# Patient Record
Sex: Male | Born: 1955 | Race: Black or African American | Hispanic: No | Marital: Single | Smoking: Current every day smoker
Health system: Southern US, Community
[De-identification: ages and names within clinical notes are randomized; demographics above are authoritative.]

## PROBLEM LIST (undated history)

## (undated) DIAGNOSIS — E119 Type 2 diabetes mellitus without complications: Secondary | ICD-10-CM

## (undated) DIAGNOSIS — C801 Malignant (primary) neoplasm, unspecified: Secondary | ICD-10-CM

## (undated) DIAGNOSIS — J9819 Other pulmonary collapse: Secondary | ICD-10-CM

## (undated) DIAGNOSIS — I10 Essential (primary) hypertension: Secondary | ICD-10-CM

## (undated) HISTORY — PX: BELOW KNEE LEG AMPUTATION: SUR23

---

## 2020-12-17 ENCOUNTER — Encounter: Payer: Self-pay | Admitting: Urology

## 2020-12-17 ENCOUNTER — Ambulatory Visit (INDEPENDENT_AMBULATORY_CARE_PROVIDER_SITE_OTHER): Payer: Medicare PPO | Admitting: Urology

## 2020-12-17 ENCOUNTER — Other Ambulatory Visit: Payer: Self-pay

## 2020-12-17 VITALS — BP 139/66 | HR 104 | Temp 98.9°F

## 2020-12-17 DIAGNOSIS — R972 Elevated prostate specific antigen [PSA]: Secondary | ICD-10-CM

## 2020-12-17 LAB — URINALYSIS, ROUTINE W REFLEX MICROSCOPIC
Bilirubin, UA: NEGATIVE
Glucose, UA: NEGATIVE
Nitrite, UA: NEGATIVE
Protein,UA: NEGATIVE
RBC, UA: NEGATIVE
Specific Gravity, UA: 1.02 (ref 1.005–1.030)
Urobilinogen, Ur: 0.2 mg/dL (ref 0.2–1.0)
pH, UA: 5.5 (ref 5.0–7.5)

## 2020-12-17 LAB — MICROSCOPIC EXAMINATION
Bacteria, UA: NONE SEEN
RBC, Urine: NONE SEEN /hpf (ref 0–2)
Renal Epithel, UA: NONE SEEN /hpf

## 2020-12-17 MED ORDER — LEVOFLOXACIN 750 MG PO TABS: 750.0000 mg | ORAL_TABLET | Freq: Once | ORAL | 0 refills | Status: AC

## 2020-12-17 NOTE — Progress Notes (Signed)
Urological Symptom Review  Patient is experiencing the following symptoms: Frequent urination Get up at night to urinate Leakage of urine   Review of Systems  Gastrointestinal (upper)  : Negative for upper GI symptoms  Gastrointestinal (lower) : Negative for lower GI symptoms  Constitutional : Negative for symptoms  Skin: Negative for skin symptoms  Eyes: Negative for eye symptoms  Ear/Nose/Throat : Negative for Ear/Nose/Throat symptoms  Hematologic/Lymphatic: Easy bruising  Cardiovascular : Negative for cardiovascular symptoms  Respiratory : Negative for respiratory symptoms  Endocrine: Negative for endocrine symptoms  Musculoskeletal: Negative for musculoskeletal symptoms  Neurological: Negative for neurological symptoms  Psychologic: Negative for psychiatric symptoms

## 2020-12-17 NOTE — Progress Notes (Signed)
Sent via fax to Dr. Tobey Grim [facility] fax # [phone]

## 2020-12-17 NOTE — Patient Instructions (Addendum)
   Appointment Time:12:30 Please arrive by 12:15 Appointment Date:01/14/2021  Location: [facility]   Prostate Biopsy Instructions  Stop all aspirin or blood thinners (aspirin, plavix, coumadin, warfarin, motrin, ibuprofen, advil, aleve, naproxen, naprosyn) for 7 days prior to the procedure.  If you have any questions about stopping these medications, please contact your primary care physician or cardiologist.  Having a light meal prior to the procedure is recommended.  If you are diabetic or have low blood sugar please bring a small snack or glucose tablet.  A Fleets enema is needed to be purchased over the counter at a local pharmacy and used 2 hours before you scheduled appointment.  This can be purchased over the counter at any pharmacy.  Antibiotics will be administered in the clinic at the time of the procedure and 1 tablet has been sent to your pharmacy. Please take the antibiotic as prescribed.    Please bring someone with you to the procedure to drive you home if you are given a valium to take prior to your procedure.   If you have any questions or concerns, please feel free to call the office at [phone] or send a Mychart message.    We will call you for your follow up biopsy talk with Dr. Leonidas Romberg    Thank you, [facility]

## 2020-12-17 NOTE — Progress Notes (Signed)
12/17/2020 11:24 AM   Andrew Ferrell [birth year] [id number]  Referring provider: No referring provider defined for this encounter.  No chief complaint on file.   HPI:  New patient-  1) PSA elevation-patient with a PSA September 2022 of 7.5.  His prior PSA March 2022 was 10.5, September 2020 4.9, July 2019 5.04. No h/o BPH. AUASS = 17. Mixed QOL.  No prior biopsy.   His dad had PCa.   He is on Plavix. He's not sure why he's on it. 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 He saw Dr. Tobey Grim at [facility] - fax [phone]   PMH: No past medical history on file.  Surgical History: Left BKA   Home Medications:  Allergies as of 12/17/2020   Not on File      Medication List        Accurate as of December 17, 2020 11:24 AM. If you have any questions, ask your nurse or doctor.          acetaminophen 500 MG tablet Commonly known as: TYLENOL Take by mouth.   aspirin 81 MG EC tablet Take by mouth.   atorvastatin 20 MG tablet Commonly known as: LIPITOR Take by mouth.   benazepril-hydrochlorthiazide 5-6.25 MG tablet Commonly known as: LOTENSIN HCT Take 1 tablet by mouth daily.   clopidogrel 75 MG tablet Commonly known as: PLAVIX Take by mouth.   empagliflozin 25 MG Tabs tablet Commonly known as: JARDIANCE Take by mouth.   gabapentin 100 MG capsule Commonly known as: NEURONTIN Take by mouth.   gabapentin 300 MG capsule Commonly known as: NEURONTIN Take by mouth.   glipiZIDE 10 MG tablet Commonly known as: GLUCOTROL Take by mouth.   metFORMIN 1000 MG tablet Commonly known as: GLUCOPHAGE Take by mouth.   pioglitazone 15 MG tablet Commonly known as: ACTOS Take by mouth.        Allergies: Not on File  Family History: No family history on file.  Social History:  reports that he has been smoking cigarettes. He has been smoking an average of .25 packs per day. He uses smokeless tobacco. No history on file for  alcohol use and drug use.   Physical Exam: BP 139/66   Pulse (!) 104   Temp 98.9 F (37.2 C)   Constitutional:  Alert and oriented, No acute distress. HEENT: [place] AT, moist mucus membranes.  Trachea midline, no masses. Cardiovascular: No clubbing, cyanosis, or edema. Respiratory: Normal respiratory effort, no increased work of breathing. GI: Abdomen is soft, nontender, nondistended, no abdominal masses GU: No CVA tenderness Lymph: No cervical or inguinal lymphadenopathy. Skin: No rashes, bruises or suspicious lesions. Neurologic: Grossly intact, no focal deficits, moving all 4 extremities. Psychiatric: Normal mood and affect. GU: Penis circumcised, normal foreskin, testicles descended bilaterally and palpably normal, bilateral epididymis palpably normal, scrotum normal DRE: Prostate 30 g, right nodule about 10 mm near base  Laboratory Data: No results found for: WBC, HGB, HCT, MCV, PLT  No results found for: CREATININE  No results found for: PSA  No results found for: TESTOSTERONE  No results found for: HGBA1C  Urinalysis No results found for: COLORURINE, APPEARANCEUR, LABSPEC, PHURINE, GLUCOSEU, HGBUR, BILIRUBINUR, KETONESUR, PROTEINUR, UROBILINOGEN, NITRITE, LEUKOCYTESUR  No results found for: LABMICR, WBCUA, RBCUA, LABEPIT, MUCUS, BACTERIA    Assessment & Plan:    1. Elevated PSA I had a long discussion with the patient on the nature of elevated PSA - benign vs malignant causes. We  discussed age specific levels and that PCa can be seen on a biopsy with very low PSA levels (<=2.5). We discussed the nature risks and benefits of continued surveillance, other lab tests, imaging as well as prostate biopsy. We discussed the management of prostate cancer might include active surveillance or treatment depending on biopsy findings. All questions answered. I recommended a prostate biopsy and PSA was repeated.  He said that he had a spot on his lung that is just being monitored and  requested we do the same.  We discussed with his PSA levels (elevated since 2019) and prostate exam, he could have a significant prostate cancer that could be life-threatening.  Again I did recommend a biopsy and we went over the risk of bleeding and infection among others.  I could sense some hesitation.  He said he might get a second opinion, which I encouraged. Again I told him he could have a life-threatening problem and recommended biopsy.  I will repeat a PSA and have nurse go over biopsy instructions.  Again we discussed other lab tests or MRI but with his PSA being elevated and abnormal exam or biopsy makes sense upfront.  If it MRI were positive he need a biopsy and if it were negative we would still recommend a biopsy. He will consider.   - Urinalysis, Routine w reflex microscopic   No follow-ups on file.  Festus Aloe, MD  [facility]  [address] [place], [place] [address] [phone]

## 2020-12-18 LAB — PSA: Prostate Specific Ag, Serum: 8.8 ng/mL — ABNORMAL HIGH (ref 0.0–4.0)

## 2020-12-24 ENCOUNTER — Telehealth: Payer: Self-pay

## 2020-12-24 NOTE — Telephone Encounter (Signed)
Patient called today and needed clarification for prostate biopsy instructions.    Appointment Time: Appointment Date:  Location: [facility]   Prostate Biopsy Instructions  Stop all aspirin or blood thinners (aspirin, plavix, coumadin, warfarin, motrin, ibuprofen, advil, aleve, naproxen, naprosyn) for 7 days prior to the procedure.  If you have any questions about stopping these medications, please contact your primary care physician or cardiologist.  Having a light meal prior to the procedure is recommended.  If you are diabetic or have low blood sugar please bring a small snack or glucose tablet.  A Fleets enema is needed to be purchased over the counter at a local pharmacy and used 2 hours before you scheduled appointment.  This can be purchased over the counter at any pharmacy.  Antibiotics will be administered in the clinic at the time of the procedure and 1 tablet has been sent to your pharmacy. Please take the antibiotic as prescribed.    Please bring someone with you to the procedure to drive you home if you are given a valium to take prior to your procedure.   If you have any questions or concerns, please feel free to call the office at [phone] or send a Mychart message.    Reviewed with patient again. Patient voiced understanding.

## 2021-01-14 ENCOUNTER — Ambulatory Visit: Payer: Medicare PPO | Admitting: Urology

## 2021-01-14 ENCOUNTER — Ambulatory Visit (HOSPITAL_COMMUNITY)
Admission: RE | Admit: 2021-01-14 | Discharge: 2021-01-14 | Disposition: A | Discharge status: Home / Self Care | Payer: Medicare PPO | Source: Ambulatory Visit | Attending: Urology | Admitting: Urology

## 2021-01-14 ENCOUNTER — Other Ambulatory Visit: Payer: Self-pay

## 2021-01-14 ENCOUNTER — Other Ambulatory Visit: Payer: Self-pay | Admitting: Urology

## 2021-01-14 DIAGNOSIS — Z89512 Acquired absence of left leg below knee: Secondary | ICD-10-CM | POA: Diagnosis not present

## 2021-01-14 DIAGNOSIS — R972 Elevated prostate specific antigen [PSA]: Secondary | ICD-10-CM | POA: Diagnosis present

## 2021-01-14 DIAGNOSIS — Z7901 Long term (current) use of anticoagulants: Secondary | ICD-10-CM | POA: Insufficient documentation

## 2021-01-14 DIAGNOSIS — C61 Malignant neoplasm of prostate: Secondary | ICD-10-CM | POA: Diagnosis not present

## 2021-01-14 DIAGNOSIS — Z8042 Family history of malignant neoplasm of prostate: Secondary | ICD-10-CM | POA: Diagnosis not present

## 2021-01-14 MED ORDER — LIDOCAINE HCL (PF) 1 % IJ SOLN
INTRAMUSCULAR | Status: AC
  Administered 2021-01-14: 13:00:00 1 mL via INTRAMUSCULAR
  Filled 2021-01-14: qty 5

## 2021-01-14 MED ORDER — GENTAMICIN SULFATE 40 MG/ML IJ SOLN
INTRAMUSCULAR | Status: AC
  Administered 2021-01-14: 13:00:00 80 mg via INTRAMUSCULAR
  Filled 2021-01-14: qty 2

## 2021-01-14 MED ORDER — GENTAMICIN SULFATE 40 MG/ML IJ SOLN: 160.0000 mg | Freq: Once | INTRAMUSCULAR | Status: DC

## 2021-01-14 MED ORDER — LIDOCAINE HCL (PF) 1 % IJ SOLN: 1.0000 mL | Freq: Once | INTRAMUSCULAR | Status: DC

## 2021-01-14 NOTE — Progress Notes (Signed)
Prostate Biopsy Procedure   1) PSA elevation-patient with a PSA September 2022 of 7.5. His prior PSA March 2022 was 10.5, September 2020 4.9, July 2019 5.04.  No h/o BPH. AUASS = 17. Mixed QOL.  No prior biopsy.    His dad had PCa.    He is on Plavix. He's not sure why he's on it. 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 He saw Dr. Tobey Grim at [facility] - fax [phone]  He had a right base prostate nodule on exam Nov 2022 with repeat PSA 8.8.    Informed consent was obtained after discussing risks/benefits of the procedure.  A time out was performed to ensure correct patient identity.  Pre-Procedure: - Last PSA Level: No results found for: PSA - Gentamicin given prophylactically - Levaquin 500 mg administered PO -DRE: left mid hard nodule -Transrectal Ultrasound performed revealing a 84.16 gm prostate -No significant hypoechoic or median lobe noted  Procedure: - Prostate block performed using 10 cc 1% lidocaine and biopsies taken from sextant areas, a total of 12 under ultrasound guidance.  Post-Procedure: - Patient tolerated the procedure well - He was counseled to seek immediate medical attention if experiences any severe pain, significant bleeding, or fevers - Return in one week to discuss biopsy results  -Discussed with daughter in waiting room - nature of PSA elevation,management of bx results, BPH and PSAD - pt asked me to discuss with his daughter

## 2021-01-23 ENCOUNTER — Telehealth: Payer: Self-pay | Admitting: Urology

## 2021-01-23 DIAGNOSIS — C61 Malignant neoplasm of prostate: Secondary | ICD-10-CM

## 2021-01-23 NOTE — Telephone Encounter (Signed)
I called and spoke to Andrew Ferrell.  He is doing well after the biopsy.  We went over his stage grade and prognosis and discussed the management of prostate cancer.  We discussed surgery versus radiation.  He will consider.  He will need a PET scan.  We will send the results to the [facility] so that they can treat him or refer for treatment (Dr. Tobey Grim at [facility] - fax [phone]).  December 2022 high risk prostate cancer PSA 8.8 (7.5-10.5) T2b (left nodule) Gleason 4+5, 4+4, 4+3 in 10 - 40% of the cores, all on the left. 4/12

## 2021-02-04 ENCOUNTER — Telehealth: Payer: Self-pay | Admitting: Urology

## 2021-02-04 NOTE — Telephone Encounter (Signed)
Telephone encounter created in error.

## 2021-02-11 ENCOUNTER — Encounter: Payer: Self-pay | Admitting: Urology

## 2021-02-11 ENCOUNTER — Other Ambulatory Visit: Payer: Self-pay

## 2021-02-11 ENCOUNTER — Telehealth: Payer: Self-pay

## 2021-02-11 ENCOUNTER — Ambulatory Visit (INDEPENDENT_AMBULATORY_CARE_PROVIDER_SITE_OTHER): Payer: Medicare PPO | Admitting: Urology

## 2021-02-11 VITALS — BP 178/81 | HR 76

## 2021-02-11 DIAGNOSIS — C61 Malignant neoplasm of prostate: Secondary | ICD-10-CM | POA: Diagnosis not present

## 2021-02-11 NOTE — Telephone Encounter (Signed)
Patient called and made aware that referral was placed to Dr. [facility] and to notify [facility]. Patient voiced understanding and will call [facility].

## 2021-02-11 NOTE — Progress Notes (Signed)
02/11/2021 2:16 PM   Andrew Ferrell [birth year] [id number]  Referring provider: No referring provider defined for this encounter.  No chief complaint on file.   HPI:  F/u -   1) Prostate cancer - Andrew Ferrell was diagnosed with high risk prostate cancer December 2022.  He follows up today to review biopsy results.  Biopsy: December 2022 high risk prostate cancer PSA 8.8 (7.5-10.5) T2b (left nodule) Gleason 4+5, 4+4, 4+3 in 10 - 40% of the cores, all on the left. 4/12  Biopsy done due to elevated PSA with PSA September 2022 of 7.5. His prior PSA March 2022 was 10.5, September 2020 4.9, July 2019 5.04 and a left sided prostate nodule on exam.    No h/o BPH. AUASS was 17 but today a zero - reports he is voiding well. Only has Noc x 2-3.    His dad had PCa.    He is on Plavix. He's not sure why he's on it. 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 He has DM. He saw Dr. Tobey Grim at [facility] - fax [phone]   PMH: No past medical history on file.  Surgical History: No past surgical history on file.  Home Medications:  Allergies as of 02/11/2021   No Known Allergies      Medication List        Accurate as of February 11, 2021  2:16 PM. If you have any questions, ask your nurse or doctor.          acetaminophen 500 MG tablet Commonly known as: TYLENOL Take by mouth.   aspirin 81 MG EC tablet Take by mouth.   atorvastatin 20 MG tablet Commonly known as: LIPITOR Take by mouth.   benazepril-hydrochlorthiazide 5-6.25 MG tablet Commonly known as: LOTENSIN HCT Take 1 tablet by mouth daily.   clopidogrel 75 MG tablet Commonly known as: PLAVIX Take by mouth.   empagliflozin 25 MG Tabs tablet Commonly known as: JARDIANCE Take by mouth.   gabapentin 100 MG capsule Commonly known as: NEURONTIN Take by mouth.   gabapentin 300 MG capsule Commonly known as: NEURONTIN Take by mouth.   glipiZIDE 10 MG tablet Commonly known as:  GLUCOTROL Take by mouth.   metFORMIN 1000 MG tablet Commonly known as: GLUCOPHAGE Take by mouth.   pioglitazone 15 MG tablet Commonly known as: ACTOS Take by mouth.        Allergies: No Known Allergies  Family History: No family history on file.  Social History:  reports that he has been smoking cigarettes. He has been smoking an average of .25 packs per day. He uses smokeless tobacco. No history on file for alcohol use and drug use.   Physical Exam: BP (!) 178/81    Pulse 76   Constitutional:  Alert and oriented, No acute distress. HEENT: [place] AT, moist mucus membranes.  Trachea midline, no masses. Cardiovascular: No clubbing, cyanosis, or edema. Respiratory: Normal respiratory effort, no increased work of breathing. GI: Abdomen is soft, nontender, nondistended, no abdominal masses Skin: No rashes, bruises or suspicious lesions. Neurologic: Grossly intact, no focal deficits, moving all 4 extremities. Psychiatric: Normal mood and affect.  Laboratory Data: No results found for: WBC, HGB, HCT, MCV, PLT  No results found for: CREATININE  No results found for: PSA  No results found for: TESTOSTERONE  No results found for: HGBA1C  Urinalysis    Component Value Date/Time   APPEARANCEUR Clear 12/17/2020 1212   GLUCOSEU Negative 12/17/2020 1212  BILIRUBINUR Negative 12/17/2020 1212   PROTEINUR Negative 12/17/2020 1212   NITRITE Negative 12/17/2020 1212   LEUKOCYTESUR Trace (A) 12/17/2020 1212    Lab Results  Component Value Date   LABMICR See below: 12/17/2020   WBCUA 0-5 12/17/2020   LABEPIT 0-10 12/17/2020   BACTERIA None seen 12/17/2020      Assessment & Plan:    Prostate cancer - I had a long discussion with the patient and his three daughters using the Prostate Cancer handout, the prostate cancer door poster and his pathology report as a guide. We went his stage, grade and prognosis and the relevant anatomy. We discussed the nature risks and benefits of  active surveillance, radical prostatectomy, IMRT (+/- brachytherapy, +/- ADT). We discussed specifically how each treatment might affect the bowel, bladder and sexual function. We discussed how each treatment might effect salvage treatments. We discussed the role of other modalities in the treatment of prostate cancer including chemotherapy, HIFU and cryotherapy and whole gland vs focal therapy.   After our discussion we will stage with PET scan and refer to Dr. Sharyn Dross, [place] for discussion.    No follow-ups on file.  Festus Aloe, MD  [facility]  [address] [place], [place] [address] [phone]

## 2021-02-11 NOTE — Progress Notes (Signed)

## 2021-02-26 ENCOUNTER — Encounter (HOSPITAL_COMMUNITY)
Admission: RE | Admit: 2021-02-26 | Discharge: 2021-02-26 | Disposition: A | Discharge status: Home / Self Care | Payer: Medicare PPO | Source: Ambulatory Visit | Attending: Urology | Admitting: Urology

## 2021-02-26 ENCOUNTER — Other Ambulatory Visit: Payer: Self-pay

## 2021-02-26 DIAGNOSIS — C61 Malignant neoplasm of prostate: Secondary | ICD-10-CM | POA: Diagnosis present

## 2021-02-26 MED ORDER — PIFLIFOLASTAT F 18 (PYLARIFY) INJECTION
9.0000 | Freq: Once | INTRAVENOUS | Status: AC
  Administered 2021-02-26: 7.5 via INTRAVENOUS

## 2021-02-28 ENCOUNTER — Telehealth: Payer: Self-pay

## 2021-02-28 NOTE — Telephone Encounter (Signed)
I called patient to notify of results-  Patient asked if I could go over his prostate cancer results and staging. I informed patient I was not the doctor but but Dr. Lynnette Caffey could review this with patient when he meets with him and I would send a message to Dr. Junious Silk as well.  Patient began yelling saying, " you haven't told me nothing I want to hear" patient hung the phone up.  Message sent to MD to have MD call patient.   Results faxed through epic to Dr. Lynnette Caffey.

## 2021-02-28 NOTE — Telephone Encounter (Signed)
-----   Message from Festus Aloe, MD sent at 02/27/2021 11:55 AM EST ----- Please let Lenox Ahr know his PET scan did not show any spread of prostate cancer and please forward a copy to Dr. Sharyn Dross [facility]. Thank you.   ----- Message ----- From: Dorisann Frames, RN Sent: 02/26/2021   4:41 PM EST To: Festus Aloe, MD  Please review

## 2021-03-12 ENCOUNTER — Telehealth: Payer: Self-pay

## 2021-03-12 NOTE — Telephone Encounter (Signed)
Pt left a voicemail:  Needing a call back about having seed implants.  Please advise.  Call back:  [phone]  Thanks, Helene Kelp

## 2021-03-13 NOTE — Telephone Encounter (Signed)
Dr. Junious Silk,  Patient saw [facility] oncology - can you review his chart- he would like to proceed with "seeds"

## 2021-03-18 NOTE — Telephone Encounter (Signed)
I have Andrew Ferrell scheduled for his [place] this week  I will need posting sheet for fiducial markers please. Also, [facility]. He will need to hold that- are you okay with writing to hold or do I need to reach out to [facility] hospital MD to receive approval?

## 2021-03-20 ENCOUNTER — Other Ambulatory Visit: Payer: Self-pay

## 2021-03-20 NOTE — Telephone Encounter (Signed)
Patient called and notified. Per Dr. Alyson Ingles, plavix hold for 5 days.

## 2021-03-20 NOTE — Progress Notes (Signed)
[facility] Posting Form - received paper form   Surgery Date/Time: 03/28/2021   Surgeon: Dr. Nicolette Bang  Surgery Location: [facility]  Diagnosis: prostate cancer   CPT: 24580 [id number]   Surgery: Fiducial markers with space OAR  Stop Anticoagulations: patient will hold Plavix 5 days prior    Cardiac/Medical/Pulmonary Clearance needed: none   Orders entered into EPIC  Date: 03/20/2021    Case booked in [place]  Date: 03/20/2021   Notified pt of Surgery: Date: 03/20/2021   Placed into Prior Authorization Work Fabio Bering Date: [facility] medicare     Assistant/laser/rep:none.   I spoke with Sheran Spine. We have discussed possible surgery dates and 03/28/2021 was agreed upon by all parties. Patient given information about surgery date, what to expect pre-operatively and post operatively.    We discussed that a pre-op nurse will be calling to set up the pre-op visit that will take place prior to surgery. Informed patient that our office will communicate any additional care to be provided after surgery.    Patients questions or concerns were discussed during our call. Advised to call our office should there be any additional information, questions or concerns that arise. Patient verbalized understanding.    Patient also instructed to hold Plavix 5 days prior to surgery. Patient voiced understanding of medication hold .

## 2021-03-21 ENCOUNTER — Ambulatory Visit: Payer: Medicaid Other

## 2021-03-22 ENCOUNTER — Ambulatory Visit: Payer: Medicare PPO

## 2021-03-25 NOTE — Patient Instructions (Addendum)
Andrew Ferrell  03/25/2021     @PREFPERIOPPHARMACY @   Your procedure is scheduled on  03/28/2021.   Report to [facility] at  1100 A.M.   Call this number if you have problems the morning of surgery:  [phone]   Remember:  Do not eat or drink after midnight.     Your last dose of plavix should have been 02/28/2021.     Take these medicines the morning of surgery with A SIP OF WATER                                         gabapentin    Do not wear jewelry, make-up or nail polish.  Do not wear lotions, powders, or perfumes, or deodorant.  Do not shave 48 hours prior to surgery.  Men may shave face and neck.  Do not bring valuables to the hospital.  [facility] is not responsible for any belongings or valuables.  Contacts, dentures or bridgework may not be worn into surgery.  Leave your suitcase in the car.  After surgery it may be brought to your room.  For patients admitted to the hospital, discharge time will be determined by your treatment team.  Patients discharged the day of surgery will not be allowed to drive home and must have someone with them for 24 hours.    Special instructions:   DO NOT smoke tobacco or vape for 24 hours before your procedure.  Please read over the following fact sheets that you were given. Coughing and Deep Breathing, Surgical Site Infection Prevention, Anesthesia Post-op Instructions, and Care and Recovery After Surgery       Transrectal Ultrasound-Guided Prostate Gold Seed Placement, Care After The following information offers guidance on how to care for yourself after your procedure. Your health care provider may also give you more specific instructions. If you have problems or questions, contact your health care provider. What can I expect after the procedure? After the procedure, it is common to have: Light bleeding from the rectum. Bruising or tenderness in the area behind the scrotum (perineum), if the needle was put into  your prostate through this area. Small amounts of blood in your urine. This should only last for a few days. Light brown or red semen. This may last for a couple of weeks. Follow these instructions at home: Medicines  Take over-the-counter and prescription medicines only as told by your health care provider. If you were prescribed an antibiotic, take it as told by your health care provider. Do not stop taking the antibiotic early, even if you start to feel better. Ask your health care provider if the medicine prescribed to you requires you to avoid driving or using machinery. Eating and drinking Follow instructions from your health care provider about eating or drinking restrictions. Drink enough fluid to keep your urine pale yellow. Managing pain and swelling If directed, put ice on the affected area. To do this: Put ice in a plastic bag. Place a towel between your skin and the bag. Leave the ice on for 20 minutes, 2-3 times a day. Be sure to remove the ice if your skin turns bright red. If you cannot feel pain, heat, or cold, you have a greater risk of damage to the area. Try not to sit directly on the area behind the scrotum. A soft cushion can  help with discomfort. Activity If you were given a sedative during the procedure, it can affect you for several hours. Do not drive or operate machinery until your health care provider says that it is safe. Return to your normal activities as told by your health care provider. Ask your health care provider what activities are safe for you. Follow instructions from your health care provider about when it is safe for you to engage in sexual activity. General instructions Plan to have a responsible adult care for you for the time you are told after you leave the hospital or clinic. Do not take baths, swim, or use a hot tub until your health care provider approves. Ask your health care provider if you may take showers. You may only be allowed to take  sponge baths. Keep all follow-up visits. Contact a health care provider if: You have a fever or chills. You have more blood in your urine. You have blood in your urine for more than 2-3 days after the procedure. You have trouble passing urine or having a bowel movement. You have pain or burning when urinating. You have nausea or you vomit. Get help right away if: You have severe pain that does not get better with medicine. Your urine is bright red. You cannot urinate. You have rectal bleeding that gets worse. You have shortness of breath. Summary After the procedure, you may have blood in your urine and light bleeding from the rectum. Return to your normal activities as told by your health care provider. Ask your health care provider what activities are safe for you. Take over-the-counter and prescription medicines only as told by your health care provider. Contact your health care provider right away if your urine is bright red or you cannot pass urine. This information is not intended to replace advice given to you by your health care provider. Make sure you discuss any questions you have with your health care provider. Document Revised: 04/11/2020 Document Reviewed: 04/11/2020 Elsevier Patient Education  2022 [place]. How to Use Chlorhexidine for Bathing Chlorhexidine gluconate (CHG) is a germ-killing (antiseptic) solution that is used to clean the skin. It can get rid of the bacteria that normally live on the skin and can keep them away for about 24 hours. To clean your skin with CHG, you may be given: A CHG solution to use in the shower or as part of a sponge bath. A prepackaged cloth that contains CHG. Cleaning your skin with CHG may help lower the risk for infection: While you are staying in the intensive care unit of the hospital. If you have a vascular access, such as a central line, to provide short-term or long-term access to your veins. If you have a catheter to drain  urine from your bladder. If you are on a ventilator. A ventilator is a machine that helps you breathe by moving air in and out of your lungs. After surgery. What are the risks? Risks of using CHG include: A skin reaction. Hearing loss, if CHG gets in your ears and you have a perforated eardrum. Eye injury, if CHG gets in your eyes and is not rinsed out. The CHG product catching fire. Make sure that you avoid smoking and flames after applying CHG to your skin. Do not use CHG: If you have a chlorhexidine allergy or have previously reacted to chlorhexidine. On babies younger than 24 months of age. How to use CHG solution Use CHG only as told by your health care provider,  and follow the instructions on the label. Use the full amount of CHG as directed. Usually, this is one bottle. During a shower Follow these steps when using CHG solution during a shower (unless your health care provider gives you different instructions): Start the shower. Use your normal soap and shampoo to wash your face and hair. Turn off the shower or move out of the shower stream. Pour the CHG onto a clean washcloth. Do not use any type of brush or rough-edged sponge. Starting at your neck, lather your body down to your toes. Make sure you follow these instructions: If you will be having surgery, pay special attention to the part of your body where you will be having surgery. Scrub this area for at least 1 minute. Do not use CHG on your head or face. If the solution gets into your ears or eyes, rinse them well with water. Avoid your genital area. Avoid any areas of skin that have broken skin, cuts, or scrapes. Scrub your back and under your arms. Make sure to wash skin folds. Let the lather sit on your skin for 1-2 minutes or as long as told by your health care provider. Thoroughly rinse your entire body in the shower. Make sure that all body creases and crevices are rinsed well. Dry off with a clean towel. Do not put  any substances on your body afterward--such as powder, lotion, or perfume--unless you are told to do so by your health care provider. Only use lotions that are recommended by the manufacturer. Put on clean clothes or pajamas. If it is the night before your surgery, sleep in clean sheets.  During a sponge bath Follow these steps when using CHG solution during a sponge bath (unless your health care provider gives you different instructions): Use your normal soap and shampoo to wash your face and hair. Pour the CHG onto a clean washcloth. Starting at your neck, lather your body down to your toes. Make sure you follow these instructions: If you will be having surgery, pay special attention to the part of your body where you will be having surgery. Scrub this area for at least 1 minute. Do not use CHG on your head or face. If the solution gets into your ears or eyes, rinse them well with water. Avoid your genital area. Avoid any areas of skin that have broken skin, cuts, or scrapes. Scrub your back and under your arms. Make sure to wash skin folds. Let the lather sit on your skin for 1-2 minutes or as long as told by your health care provider. Using a different clean, wet washcloth, thoroughly rinse your entire body. Make sure that all body creases and crevices are rinsed well. Dry off with a clean towel. Do not put any substances on your body afterward--such as powder, lotion, or perfume--unless you are told to do so by your health care provider. Only use lotions that are recommended by the manufacturer. Put on clean clothes or pajamas. If it is the night before your surgery, sleep in clean sheets. How to use CHG prepackaged cloths Only use CHG cloths as told by your health care provider, and follow the instructions on the label. Use the CHG cloth on clean, dry skin. Do not use the CHG cloth on your head or face unless your health care provider tells you to. When washing with the CHG cloth: Avoid  your genital area. Avoid any areas of skin that have broken skin, cuts, or scrapes. Before surgery Follow  these steps when using a CHG cloth to clean before surgery (unless your health care provider gives you different instructions): Using the CHG cloth, vigorously scrub the part of your body where you will be having surgery. Scrub using a back-and-forth motion for 3 minutes. The area on your body should be completely wet with CHG when you are done scrubbing. Do not rinse. Discard the cloth and let the area air-dry. Do not put any substances on the area afterward, such as powder, lotion, or perfume. Put on clean clothes or pajamas. If it is the night before your surgery, sleep in clean sheets.  For general bathing Follow these steps when using CHG cloths for general bathing (unless your health care provider gives you different instructions). Use a separate CHG cloth for each area of your body. Make sure you wash between any folds of skin and between your fingers and toes. Wash your body in the following order, switching to a new cloth after each step: The front of your neck, shoulders, and chest. Both of your arms, under your arms, and your hands. Your stomach and groin area, avoiding the genitals. Your right leg and foot. Your left leg and foot. The back of your neck, your back, and your buttocks. Do not rinse. Discard the cloth and let the area air-dry. Do not put any substances on your body afterward--such as powder, lotion, or perfume--unless you are told to do so by your health care provider. Only use lotions that are recommended by the manufacturer. Put on clean clothes or pajamas. Contact a health care provider if: Your skin gets irritated after scrubbing. You have questions about using your solution or cloth. You swallow any chlorhexidine. Call your local poison control center (1-859-089-3026 in the [place] Get help right away if: Your eyes itch badly, or they become very red or  swollen. Your skin itches badly and is red or swollen. Your hearing changes. You have trouble seeing. You have swelling or tingling in your mouth or throat. You have trouble breathing. These symptoms may represent a serious problem that is an emergency. Do not wait to see if the symptoms will go away. Get medical help right away. Call your local emergency services (911 in the [place] Do not drive yourself to the hospital. Summary Chlorhexidine gluconate (CHG) is a germ-killing (antiseptic) solution that is used to clean the skin. Cleaning your skin with CHG may help to lower your risk for infection. You may be given CHG to use for bathing. It may be in a bottle or in a prepackaged cloth to use on your skin. Carefully follow your health care provider's instructions and the instructions on the product label. Do not use CHG if you have a chlorhexidine allergy. Contact your health care provider if your skin gets irritated after scrubbing. This information is not intended to replace advice given to you by your health care provider. Make sure you discuss any questions you have with your health care provider. Document Revised: 03/26/2020 Document Reviewed: 03/26/2020 Elsevier Patient Education  2022 [employer].

## 2021-03-26 ENCOUNTER — Ambulatory Visit: Payer: Medicare HMO

## 2021-03-26 ENCOUNTER — Other Ambulatory Visit: Payer: Self-pay

## 2021-03-26 ENCOUNTER — Ambulatory Visit (INDEPENDENT_AMBULATORY_CARE_PROVIDER_SITE_OTHER): Payer: Medicare HMO

## 2021-03-26 DIAGNOSIS — C61 Malignant neoplasm of prostate: Secondary | ICD-10-CM

## 2021-03-26 MED ORDER — LEUPROLIDE ACETATE (6 MONTH) 45 MG ~~LOC~~ KIT
45.0000 mg | PACK | Freq: Once | SUBCUTANEOUS | Status: AC
  Administered 2021-03-26: 45 mg via SUBCUTANEOUS

## 2021-03-26 NOTE — Progress Notes (Signed)
Eligard SubQ Injection   Due to Prostate Cancer patient is present today for a Eligard Injection.  Medication: Eligard 6 month Dose: 45 mg  Location: right  Lot: [id number] Exp: [id number]  Patient tolerated well, no complications were noted  Performed by: Estill Bamberg RN

## 2021-03-27 ENCOUNTER — Encounter (HOSPITAL_COMMUNITY)
Admission: RE | Admit: 2021-03-27 | Discharge: 2021-03-27 | Disposition: A | Discharge status: Home / Self Care | Payer: Medicare HMO | Source: Ambulatory Visit | Attending: Urology | Admitting: Urology

## 2021-03-27 ENCOUNTER — Other Ambulatory Visit: Payer: Self-pay | Admitting: Urology

## 2021-03-27 ENCOUNTER — Encounter (HOSPITAL_COMMUNITY): Payer: Self-pay

## 2021-03-27 VITALS — BP 143/65 | HR 62 | Temp 98.5°F | Resp 18 | Ht 68.0 in | Wt 144.0 lb

## 2021-03-27 DIAGNOSIS — Z01818 Encounter for other preprocedural examination: Secondary | ICD-10-CM | POA: Insufficient documentation

## 2021-03-27 DIAGNOSIS — E119 Type 2 diabetes mellitus without complications: Secondary | ICD-10-CM | POA: Diagnosis not present

## 2021-03-27 HISTORY — DX: Malignant (primary) neoplasm, unspecified: C80.1

## 2021-03-27 HISTORY — DX: Other pulmonary collapse: J98.19

## 2021-03-27 HISTORY — DX: Type 2 diabetes mellitus without complications: E11.9

## 2021-03-27 HISTORY — DX: Essential (primary) hypertension: I10

## 2021-03-27 LAB — HEMOGLOBIN A1C
Hgb A1c MFr Bld: 7.9 % — ABNORMAL HIGH (ref 4.8–5.6)
Mean Plasma Glucose: 180.03 mg/dL

## 2021-03-27 LAB — BASIC METABOLIC PANEL
Anion gap: 9 (ref 5–15)
BUN: 20 mg/dL (ref 8–23)
CO2: 24 mmol/L (ref 22–32)
Calcium: 10.5 mg/dL — ABNORMAL HIGH (ref 8.9–10.3)
Chloride: 99 mmol/L (ref 98–111)
Creatinine, Ser: 0.87 mg/dL (ref 0.61–1.24)
GFR, Estimated: >60 mL/min (ref >60)
Glucose, Bld: 236 mg/dL — ABNORMAL HIGH (ref 70–99)
Potassium: 4.6 mmol/L (ref 3.5–5.1)
Sodium: 132 mmol/L — ABNORMAL LOW (ref 135–145)

## 2021-03-27 MED ORDER — TRAMADOL HCL 50 MG PO TABS: 50.0000 mg | ORAL_TABLET | Freq: Four times a day (QID) | ORAL | 0 refills | Status: AC | PRN

## 2021-03-28 ENCOUNTER — Encounter (HOSPITAL_COMMUNITY)
Admission: RE | Disposition: A | Discharge status: Home / Self Care | Payer: Self-pay | Source: Home / Self Care | Attending: Urology

## 2021-03-28 ENCOUNTER — Ambulatory Visit (HOSPITAL_COMMUNITY): Payer: Medicare HMO

## 2021-03-28 ENCOUNTER — Ambulatory Visit (HOSPITAL_COMMUNITY)
Admission: RE | Admit: 2021-03-28 | Discharge: 2021-03-28 | Disposition: A | Discharge status: Home / Self Care | Payer: Medicare HMO | Attending: Urology | Admitting: Urology

## 2021-03-28 ENCOUNTER — Ambulatory Visit (HOSPITAL_COMMUNITY): Payer: Medicare HMO | Admitting: Anesthesiology

## 2021-03-28 ENCOUNTER — Encounter (HOSPITAL_COMMUNITY): Payer: Self-pay | Admitting: Urology

## 2021-03-28 ENCOUNTER — Ambulatory Visit: Payer: Medicaid Other

## 2021-03-28 ENCOUNTER — Ambulatory Visit (HOSPITAL_BASED_OUTPATIENT_CLINIC_OR_DEPARTMENT_OTHER): Payer: Medicare HMO | Admitting: Anesthesiology

## 2021-03-28 DIAGNOSIS — C61 Malignant neoplasm of prostate: Secondary | ICD-10-CM | POA: Diagnosis present

## 2021-03-28 DIAGNOSIS — E119 Type 2 diabetes mellitus without complications: Secondary | ICD-10-CM | POA: Insufficient documentation

## 2021-03-28 DIAGNOSIS — I10 Essential (primary) hypertension: Secondary | ICD-10-CM

## 2021-03-28 DIAGNOSIS — Z7984 Long term (current) use of oral hypoglycemic drugs: Secondary | ICD-10-CM

## 2021-03-28 DIAGNOSIS — F17210 Nicotine dependence, cigarettes, uncomplicated: Secondary | ICD-10-CM | POA: Diagnosis not present

## 2021-03-28 DIAGNOSIS — N529 Male erectile dysfunction, unspecified: Secondary | ICD-10-CM | POA: Diagnosis not present

## 2021-03-28 HISTORY — PX: SPACE OAR INSTILLATION: SHX6769

## 2021-03-28 HISTORY — PX: GOLD SEED IMPLANT: SHX6343

## 2021-03-28 LAB — GLUCOSE, CAPILLARY: Glucose-Capillary: 141 mg/dL — ABNORMAL HIGH (ref 70–99)

## 2021-03-28 SURGERY — INSERTION, GOLD SEEDS
Anesthesia: General | Site: Prostate

## 2021-03-28 MED ORDER — CHLORHEXIDINE GLUCONATE 0.12 % MT SOLN
15.0000 mL | Freq: Once | OROMUCOSAL | Status: AC
  Administered 2021-03-28: 15 mL via OROMUCOSAL

## 2021-03-28 MED ORDER — SODIUM CHLORIDE (PF) 0.9 % IJ SOLN
INTRAMUSCULAR | Status: DC | PRN
  Administered 2021-03-28: 10 mL

## 2021-03-28 MED ORDER — FENTANYL CITRATE (PF) 100 MCG/2ML IJ SOLN
INTRAMUSCULAR | Status: AC
  Filled 2021-03-28: qty 2

## 2021-03-28 MED ORDER — CEFAZOLIN SODIUM-DEXTROSE 2-4 GM/100ML-% IV SOLN
INTRAVENOUS | Status: AC
  Filled 2021-03-28: qty 100

## 2021-03-28 MED ORDER — ORAL CARE MOUTH RINSE: 15.0000 mL | Freq: Once | OROMUCOSAL | Status: AC

## 2021-03-28 MED ORDER — ONDANSETRON HCL 4 MG/2ML IJ SOLN
INTRAMUSCULAR | Status: DC | PRN
Start: 2021-03-28 — End: 2021-03-28
  Administered 2021-03-28: 4 mg via INTRAVENOUS

## 2021-03-28 MED ORDER — FENTANYL CITRATE (PF) 250 MCG/5ML IJ SOLN
INTRAMUSCULAR | Status: DC | PRN
  Administered 2021-03-28 (×2): 25 ug via INTRAVENOUS

## 2021-03-28 MED ORDER — ONDANSETRON HCL 4 MG/2ML IJ SOLN: 4.0000 mg | Freq: Once | INTRAMUSCULAR | Status: DC | PRN

## 2021-03-28 MED ORDER — LACTATED RINGERS IV SOLN
INTRAVENOUS | Status: DC
  Administered 2021-03-28: 1000 mL via INTRAVENOUS

## 2021-03-28 MED ORDER — STERILE WATER FOR IRRIGATION IR SOLN
Status: DC | PRN
  Administered 2021-03-28: 500 mL

## 2021-03-28 MED ORDER — MEPERIDINE HCL 50 MG/ML IJ SOLN: 6.2500 mg | INTRAMUSCULAR | Status: DC | PRN

## 2021-03-28 MED ORDER — SODIUM CHLORIDE (PF) 0.9 % IJ SOLN
INTRAMUSCULAR | Status: AC
  Filled 2021-03-28: qty 10

## 2021-03-28 MED ORDER — CEFAZOLIN SODIUM-DEXTROSE 2-4 GM/100ML-% IV SOLN
2.0000 g | INTRAVENOUS | Status: AC
  Administered 2021-03-28: 2 g via INTRAVENOUS

## 2021-03-28 MED ORDER — HYDROMORPHONE HCL 1 MG/ML IJ SOLN: 0.2500 mg | INTRAMUSCULAR | Status: DC | PRN

## 2021-03-28 MED ORDER — PROPOFOL 10 MG/ML IV BOLUS
INTRAVENOUS | Status: DC | PRN
  Administered 2021-03-28: 150 mg via INTRAVENOUS

## 2021-03-28 MED ORDER — LIDOCAINE HCL (CARDIAC) PF 100 MG/5ML IV SOSY
PREFILLED_SYRINGE | INTRAVENOUS | Status: DC | PRN
  Administered 2021-03-28: 50 mg via INTRAVENOUS

## 2021-03-28 SURGICAL SUPPLY — 31 items
COVER BACK TABLE 60X90IN (DRAPES) ×2 IMPLANT
DRAPE EENT ADH APERT 15X15 STR (DRAPES) ×1 IMPLANT
DRAPE EENT ADH APERT 31X51 STR (DRAPES) ×2 IMPLANT
DRAPE HALF SHEET 40X57 (DRAPES) ×1 IMPLANT
DRAPE LEGGINS SURG 28X43 STRL (DRAPES) ×2 IMPLANT
DRSG TEGADERM 8X12 (GAUZE/BANDAGES/DRESSINGS) ×2 IMPLANT
GAUZE SPONGE 4X4 12PLY STRL (GAUZE/BANDAGES/DRESSINGS) ×4 IMPLANT
GLOVE SRG 8 PF TXTR STRL LF DI (GLOVE) ×1 IMPLANT
GLOVE SURG POLYISO LF SZ8 (GLOVE) ×2 IMPLANT
GLOVE SURG UNDER POLY LF SZ7 (GLOVE) ×2 IMPLANT
GLOVE SURG UNDER POLY LF SZ7.5 (GLOVE) ×2 IMPLANT
GLOVE SURG UNDER POLY LF SZ8 (GLOVE) ×2
GOWN STRL REUS W/TWL LRG LVL3 (GOWN DISPOSABLE) ×2 IMPLANT
GOWN STRL REUS W/TWL XL LVL3 (GOWN DISPOSABLE) ×2 IMPLANT
IMPL SPACEOAR SYSTEM 10ML (Spacer) ×1 IMPLANT
IMPLANT SPACEOAR SYSTEM 10ML (Spacer) ×2 IMPLANT
KIT TURNOVER CYSTO (KITS) ×2 IMPLANT
MARKER GOLD PRELOAD 1.2X3 (Urological Implant) ×1 IMPLANT
MARKER SKIN DUAL TIP RULER LAB (MISCELLANEOUS) ×1 IMPLANT
NDL HYPO 18GX1.5 BLUNT FILL (NEEDLE) IMPLANT
NEEDLE HYPO 18GX1.5 BLUNT FILL (NEEDLE) ×2 IMPLANT
NS IRRIG 1000ML POUR BTL (IV SOLUTION) ×2 IMPLANT
PAD ARMBOARD 7.5X6 YLW CONV (MISCELLANEOUS) ×4 IMPLANT
SEED GOLD PRELOAD 1.2X3 (Urological Implant) ×6 IMPLANT
SOL PREP POV-IOD 4OZ 10% (MISCELLANEOUS) ×2 IMPLANT
SURGILUBE 2OZ TUBE FLIPTOP (MISCELLANEOUS) ×2 IMPLANT
SYR 10ML LL (SYRINGE) ×1 IMPLANT
SYR CONTROL 10ML LL (SYRINGE) ×2 IMPLANT
TOWEL NATURAL 4PK STERILE (DISPOSABLE) ×2 IMPLANT
UNDERPAD 30X36 HEAVY ABSORB (UNDERPADS AND DIAPERS) ×2 IMPLANT
WATER STERILE IRR 500ML POUR (IV SOLUTION) ×2 IMPLANT

## 2021-03-28 NOTE — Anesthesia Postprocedure Evaluation (Signed)
Anesthesia Post Note ? ?Patient: Andrew Ferrell ? ?Procedure(s) Performed: GOLD SEED IMPLANT (Prostate) ?SPACE OAR INSTILLATION (Prostate) ? ?Patient location during evaluation: Phase II ?Anesthesia Type: General ?Level of consciousness: awake and alert and oriented ?Pain management: pain level controlled ?Vital Signs Assessment: post-procedure vital signs reviewed and stable ?Respiratory status: spontaneous breathing, nonlabored ventilation and respiratory function stable ?Cardiovascular status: blood pressure returned to baseline and stable ?Postop Assessment: no apparent nausea or vomiting ?Anesthetic complications: no ? ? ?No notable events documented. ? ? ?Last Vitals:  ?Vitals:  ? 03/28/21 1230 03/28/21 1255  ?BP: (!) 147/78 (!) 156/63  ?Pulse: 78   ?Resp: 10 20  ?Temp:    ?SpO2: 100% 99%  ?  ?Last Pain:  ?Vitals:  ? 03/28/21 1255  ?TempSrc:   ?PainSc: 3   ? ? ?  ?  ?  ?  ?  ?  ? ?Buffi Ewton C Nala Kachel ? ? ? ? ?

## 2021-03-28 NOTE — Transfer of Care (Signed)
Immediate Anesthesia Transfer of Care Note ? ?Patient: Andrew Ferrell ? ?Procedure(s) Performed: GOLD SEED IMPLANT (Prostate) ?SPACE OAR INSTILLATION (Prostate) ? ?Patient Location: PACU ? ?Anesthesia Type:General ? ?Level of Consciousness: awake, alert , oriented and patient cooperative ? ?Airway & Oxygen Therapy: Patient Spontanous Breathing and Patient connected to nasal cannula oxygen ? ?Post-op Assessment: Report given to RN, Post -op Vital signs reviewed and stable and Patient moving all extremities X 4 ? ?Post vital signs: Reviewed and stable ? ?Last Vitals:  ?Vitals Value Taken Time  ?BP    ?Temp    ?Pulse 76 03/28/21 1225  ?Resp 9 03/28/21 1225  ?SpO2 100 % 03/28/21 1225  ?Vitals shown include unvalidated device data. ? ?Last Pain:  ?Vitals:  ? 03/28/21 1138  ?TempSrc: Oral  ?PainSc: 0-No pain  ?   ? ?Patients Stated Pain Goal: 7 (03/28/21 1138) ? ?Complications: No notable events documented. ?

## 2021-03-28 NOTE — H&P (Signed)
Urology Admission H&P ? ?Chief Complaint: Prostate Cancer ? ?History of Present Illness: [name] is a 66yo here for fiducial markers and SpaceOAR for high risk prostate cancer. He has moderate mild. Mild erectile dysfunction. No complaints today ? ?Past Medical History:  ?Diagnosis Date  ? Cancer [facility])   ? prostate cancer  ? Collapsed lung   ? Diabetes mellitus without complication ([place])   ? Hypertension   ? ?Past Surgical History:  ?Procedure Laterality Date  ? BELOW KNEE LEG AMPUTATION Left   ? ? ?Home Medications:  ?No current facility-administered medications for this encounter.  ? ?Allergies: No Known Allergies ? ?No family history on file. ?Social History:  reports that he has been smoking cigarettes. He has been smoking an average of .25 packs per day. He uses smokeless tobacco. He reports current alcohol use. He reports that he does not use drugs. ? ?Review of Systems  ?All other systems reviewed and are negative. ? ?Physical Exam:  ?Vital signs in last 24 hours: ?  ?Physical Exam ?Constitutional:   ?   Appearance: Normal appearance.  ?HENT:  ?   Head: Normocephalic and atraumatic.  ?   Nose: Nose normal. No congestion.  ?Eyes:  ?   Extraocular Movements: Extraocular movements intact.  ?   Conjunctiva/sclera: Conjunctivae normal.  ?   Pupils: Pupils are equal, round, and reactive to light.  ?Cardiovascular:  ?   Rate and Rhythm: Normal rate and regular rhythm.  ?Pulmonary:  ?   Effort: Pulmonary effort is normal. No respiratory distress.  ?Abdominal:  ?   General: Abdomen is flat. There is no distension.  ?Musculoskeletal:     ?   General: No swelling. Normal range of motion.  ?   Cervical back: Normal range of motion and neck supple.  ?Skin: ?   General: Skin is warm and dry.  ?Neurological:  ?   General: No focal deficit present.  ?   Mental Status: He is alert and oriented to person, place, and time.  ?Psychiatric:     ?   Mood and Affect: Mood normal.     ?   Behavior: Behavior normal.     ?   Thought  Content: Thought content normal.     ?   Judgment: Judgment normal.  ? ? ?Laboratory Data:  ?No results found for this or any previous visit (from the past 24 hour(s)). ?No results found for this or any previous visit (from the past 240 hour(s)). ?Creatinine: ?Recent Labs  ?  03/27/21 ?0942  ?CREATININE 0.87  ? ?Baseline Creatinine: 0.87 ? ?Impression/Assessment:  ?65yo with high risk prostate cancer ? ?Plan:  ?I discussed the natural history of high risk prostate cancer with the patient and the various treatment options including active surveillance, RALP, IMRT, brachytherapy, cryotherapy, HIFU and ADT. After discussing the options the patient elects for IMRT. We will place fiducial markers and SpaceOAR. Risks/benefits/alternatives discussed ? ? ?Nicolette Bang ?03/28/2021, 11:11 AM  ? ? ? ? ? ?

## 2021-03-28 NOTE — Anesthesia Preprocedure Evaluation (Signed)
Anesthesia Evaluation  ?Patient identified by MRN, date of birth, ID band ?Patient awake ? ? ? ?Reviewed: ?Allergy & Precautions, NPO status , Patient's Chart, lab work & pertinent test results ? ?Airway ?Mallampati: II ? ?TM Distance: >3 FB ?Neck ROM: Full ? ? ? Dental ? ?(+) Edentulous Upper, Edentulous Lower ?  ?Pulmonary ?Current Smoker and Patient abstained from smoking.,  ?Collapsed lung ?  ?Pulmonary exam normal ?breath sounds clear to auscultation ? ? ? ? ? ? Cardiovascular ?hypertension, Pt. on medications ?Normal cardiovascular exam ?Rhythm:Regular Rate:Normal ? ? ?  ?Neuro/Psych ?negative neurological ROS ? negative psych ROS  ? GI/Hepatic ?negative GI ROS, Neg liver ROS,   ?Endo/Other  ?negative endocrine ROSdiabetes, Well Controlled, Type 2, Oral Hypoglycemic Agents ? Renal/GU ?negative Renal ROS  ? ?Prostate cancer ? ?  ?Musculoskeletal ?negative musculoskeletal ROS ?(+)  ? Abdominal ?  ?Peds ?negative pediatric ROS ?(+)  Hematology ?negative hematology ROS ?(+)   ?Anesthesia Other Findings ? ? Reproductive/Obstetrics ?negative OB ROS ? ?  ? ? ? ? ? ? ? ? ? ? ? ? ? ?  ?  ? ? ? ? ? ? ? ?Anesthesia Physical ?Anesthesia Plan ? ?ASA: 3 ? ?Anesthesia Plan: General  ? ?Post-op Pain Management: Dilaudid IV  ? ?Induction: Intravenous ? ?PONV Risk Score and Plan: 3 and Ondansetron ? ?Airway Management Planned: LMA ? ?Additional Equipment:  ? ?Intra-op Plan:  ? ?Post-operative Plan: Extubation in OR ? ?Informed Consent: I have reviewed the patients History and Physical, chart, labs and discussed the procedure including the risks, benefits and alternatives for the proposed anesthesia with the patient or authorized representative who has indicated his/her understanding and acceptance.  ? ? ? ?Dental advisory given ? ?Plan Discussed with: CRNA and Surgeon ? ?Anesthesia Plan Comments:   ? ? ? ? ? ?Anesthesia Quick Evaluation ? ?

## 2021-03-28 NOTE — Anesthesia Procedure Notes (Signed)
Procedure Name: LMA Insertion ?Date/Time: 03/28/2021 11:50 AM ?Performed by: Karna Dupes, CRNA ?Pre-anesthesia Checklist: Patient identified, Emergency Drugs available, Suction available and Patient being monitored ?Patient Re-evaluated:Patient Re-evaluated prior to induction ?Oxygen Delivery Method: Circle system utilized ?Preoxygenation: Pre-oxygenation with 100% oxygen ?Induction Type: IV induction ?LMA: LMA inserted ?LMA Size: 4.0 ?Number of attempts: 1 ?Placement Confirmation: positive ETCO2 and breath sounds checked- equal and bilateral ?Tube secured with: Tape ?Dental Injury: Teeth and Oropharynx as per pre-operative assessment  ? ? ? ? ?

## 2021-03-28 NOTE — Op Note (Signed)
PRE-OPERATIVE DIAGNOSIS:  Adenocarcinoma of the prostate ? ?POST-OPERATIVE DIAGNOSIS:  Same ? ?PROCEDURE: 1. Prostate Ultrasound ?2. Placement of fiducial marks ?3. Placement of SpaceOAR ? ?SURGEON:  Surgeon(s): ?Nicolette Bang, MD ? ?ANESTHESIA:  General ? ?EBL:  Minimal ? ?DRAINS: none ? ?FINDINGS: 54.6cc prostate on [place] ? ?INDICATION: Andrew Ferrell is a 66 year old with a history of T2b prostate cancer who is scheduled to undergo IMRT. He wishes to have fiducial markers and SpaceOAR placed prior to IMRT to decrease rectal toxicity. ? ?Description of procedure: After informed consent the patient was brought to the major OR, placed on the table and administered general anesthesia. He was then moved to the modified lithotomy position with his perineum perpendicular to the floor. His perineum and genitalia were then sterilely prepped. An official timeout was then performed. The transrectal ultrasound probe was placed in the rectum and affixed to the stand. He was then sterilely draped. ? ?A transrectal ultrasound of the prostate was performed.  Lidocaine  was not  instilled using ultrasound guidance into the junction of each seminal vesicle of the prostate. ? ?3 Gold markers were placed into the prostate using the standard template and ultrasound guidance.  Accurate placement of the markers was confirmed. ? ?We then proceeded to mix the SpaceOAR using the kit supplied from the manufacturer. Once this was complete we placed a sinal needle into the perirectal fat between the rectum and the prostate. Once this was accomplished we injected 2cc of normal saline to hydrodissect the plain. We then instilled the the SpaceOAR through the spinal needle and noted good distribution in the perirectal fat.  ? ?The patient was awakened and taken to recovery room in stable and satisfactory condition. He tolerated procedure well and there were no intraoperative complications. ? ?CONDITION: Stable, extubated, transferred to PACU ? ?PLAN:  The patient is to be discharged home and he will start IMRT in the next 2-3 weeks ? ?

## 2021-04-02 ENCOUNTER — Encounter (HOSPITAL_COMMUNITY): Payer: Self-pay | Admitting: Urology

## 2021-07-01 ENCOUNTER — Ambulatory Visit: Payer: Medicare HMO | Admitting: Urology

## 2021-07-15 ENCOUNTER — Ambulatory Visit (INDEPENDENT_AMBULATORY_CARE_PROVIDER_SITE_OTHER): Payer: Medicare HMO | Admitting: Urology

## 2021-07-15 ENCOUNTER — Encounter: Payer: Self-pay | Admitting: Urology

## 2021-07-15 VITALS — Ht 68.0 in | Wt 141.5 lb

## 2021-07-15 DIAGNOSIS — Z8546 Personal history of malignant neoplasm of prostate: Secondary | ICD-10-CM | POA: Diagnosis not present

## 2021-07-15 DIAGNOSIS — N5201 Erectile dysfunction due to arterial insufficiency: Secondary | ICD-10-CM

## 2021-07-15 DIAGNOSIS — C61 Malignant neoplasm of prostate: Secondary | ICD-10-CM

## 2021-07-15 MED ORDER — SILDENAFIL CITRATE 20 MG PO TABS: 20.0000 mg | ORAL_TABLET | Freq: Every day | ORAL | 11 refills | Status: DC

## 2021-07-15 NOTE — Progress Notes (Signed)
07/15/2021 3:16 PM   Andrew Ferrell 21-Aug-1955 [id number]  Referring provider: Ledell Noss [place],  [place] [id number]  No chief complaint on file.   HPI:  F/u -    1) Prostate cancer - high risk prostate cancer December 2022 being treated with LT-ADT (Feb 2023 - Feb 2025) and EBXRT completed Apr 2023 (pre-xrt spaceoar and fiducial markers done).    Biopsy: December 2022 high risk prostate cancer PSA 8.8 (7.5-10.5) T2b (left nodule) Gleason 4+5, 4+4, 4+3 in 10 - 40% of the cores, all on the left. 4/12   Biopsy done due to elevated PSA with PSA September 2022 of 7.5. His prior PSA March 2022 was 10.5, September 2020 4.9, July 2019 5.04 and a left sided prostate nodule on exam.    No h/o BPH. AUASS was 17 but today a zero - reports he is voiding well. Only has Noc x 2-3.     His dad had PCa.   He returns in management of the above. He has bothersome hot flash and low libido. No erections. Here with his daughter.    He is on Plavix. He's not sure why he's on it. 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 He has DM. He saw Dr. Tobey Grim at [facility] - fax [phone]   PMH: Past Medical History:  Diagnosis Date   Cancer [facility])    prostate cancer   Collapsed lung    Diabetes mellitus without complication ([place])    Hypertension     Surgical History: Past Surgical History:  Procedure Laterality Date   BELOW KNEE LEG AMPUTATION Left    GOLD SEED IMPLANT N/A 03/28/2021   Procedure: GOLD SEED IMPLANT;  Surgeon: Cleon Gustin, MD;  Location: [facility];  Service: Urology;  Laterality: N/A;   SPACE OAR INSTILLATION N/A 03/28/2021   Procedure: SPACE OAR INSTILLATION;  Surgeon: Cleon Gustin, MD;  Location: [facility];  Service: Urology;  Laterality: N/A;    Home Medications:  Allergies as of 07/15/2021   No Known Allergies      Medication List        Accurate as of July 15, 2021  3:16 PM. If you have any questions,  ask your nurse or doctor.          acetaminophen 500 MG tablet Commonly known as: TYLENOL Take 500 mg by mouth 2 (two) times daily.   aspirin EC 81 MG tablet Take 81 mg by mouth daily.   atorvastatin 20 MG tablet Commonly known as: LIPITOR Take 20 mg by mouth daily.   benazepril-hydrochlorthiazide 5-6.25 MG tablet Commonly known as: LOTENSIN HCT Take 1 tablet by mouth daily.   clopidogrel 75 MG tablet Commonly known as: PLAVIX Take 75 mg by mouth daily.   empagliflozin 25 MG Tabs tablet Commonly known as: JARDIANCE Take 25 mg by mouth daily.   gabapentin 100 MG capsule Commonly known as: NEURONTIN Take 100 mg by mouth at bedtime.   gabapentin 300 MG capsule Commonly known as: NEURONTIN Take 300 mg by mouth 2 (two) times daily.   glipiZIDE 10 MG tablet Commonly known as: GLUCOTROL Take 10 mg by mouth 2 (two) times daily.   metFORMIN 1000 MG tablet Commonly known as: GLUCOPHAGE Take 1,000 mg by mouth 2 (two) times daily.   pioglitazone 15 MG tablet Commonly known as: ACTOS Take 15 mg by mouth daily.   traMADol 50 MG tablet Commonly known as:  Ultram Take 1 tablet (50 mg total) by mouth every 6 (six) hours as needed.        Allergies: No Known Allergies  Family History: No family history on file.  Social History:  reports that he has been smoking cigarettes. He has been smoking an average of .25 packs per day. He uses smokeless tobacco. He reports current alcohol use. He reports that he does not use drugs.   Physical Exam: There were no vitals taken for this visit.  Constitutional:  Alert and oriented, No acute distress. HEENT: [place] AT, moist mucus membranes.  Trachea midline, no masses. Cardiovascular: No clubbing, cyanosis, or edema. Respiratory: Normal respiratory effort, no increased work of breathing. GI: Abdomen is soft, nontender, nondistended, no abdominal masses GU: No CVA tenderness Skin: No rashes, bruises or suspicious  lesions. Neurologic: Grossly intact, no focal deficits, moving all 4 extremities. Psychiatric: Normal mood and affect.  Laboratory Data: No results found for: "WBC", "HGB", "HCT", "MCV", "PLT"  Lab Results  Component Value Date   CREATININE 0.87 03/27/2021    No results found for: "PSA"  No results found for: "TESTOSTERONE"  Lab Results  Component Value Date   HGBA1C 7.9 (H) 03/27/2021    Urinalysis    Component Value Date/Time   APPEARANCEUR Clear 12/17/2020 1212   GLUCOSEU Negative 12/17/2020 1212   BILIRUBINUR Negative 12/17/2020 1212   PROTEINUR Negative 12/17/2020 1212   NITRITE Negative 12/17/2020 1212   LEUKOCYTESUR Trace (A) 12/17/2020 1212    Lab Results  Component Value Date   LABMICR See below: 12/17/2020   WBCUA 0-5 12/17/2020   LABEPIT 0-10 12/17/2020   BACTERIA None seen 12/17/2020    Pertinent Imaging: N/a  Reviewed Dr. Lynnette Caffey' notes    Assessment & Plan:    1. Prostate cancer [facility]) Discussed again nature r/b of ADT. Again I recommended 2-3 yrs and at least 18 mo. He will consider. He's thinking of stopping. T and PSA sent. We discussed that he has some side effects from treatment but also discussed his stage, grade and prognosis and the rationale for an aggressive course of action. In the end we want to keep him safe and healthy but also balance QOL.   2. ED - sildenafil sent. Discussed the nature r/b/a to pde5i. I sent a Rx.    No follow-ups on file.  Festus Aloe, MD  [facility]  [address] [place], [place] [address] [phone]

## 2021-07-16 ENCOUNTER — Telehealth: Payer: Self-pay

## 2021-07-16 LAB — MICROSCOPIC EXAMINATION
Bacteria, UA: NONE SEEN
RBC, Urine: NONE SEEN /hpf (ref 0–2)

## 2021-07-16 LAB — URINALYSIS, ROUTINE W REFLEX MICROSCOPIC
Bilirubin, UA: NEGATIVE
Glucose, UA: NEGATIVE
Ketones, UA: NEGATIVE
Nitrite, UA: NEGATIVE
Protein,UA: NEGATIVE
RBC, UA: NEGATIVE
Specific Gravity, UA: 1.01 (ref 1.005–1.030)
Urobilinogen, Ur: 0.2 mg/dL (ref 0.2–1.0)
pH, UA: 5.5 (ref 5.0–7.5)

## 2021-07-16 LAB — PSA: Prostate Specific Ag, Serum: 0.3 ng/mL (ref 0.0–4.0)

## 2021-07-16 LAB — TESTOSTERONE: Testosterone: 19 ng/dL — ABNORMAL LOW (ref 264–916)

## 2021-07-16 NOTE — Telephone Encounter (Signed)
Patient informed and voiced understanding

## 2021-07-16 NOTE — Telephone Encounter (Signed)
-----   Message from Festus Aloe, MD sent at 07/16/2021  9:26 AM EDT ----- Please let Mr Kaspar know his T is low due to the prostate cancer treatment with Eligard and his PSA is very low at 0.3. Looks great. Treating is working well.  ----- Message ----- From: Audie Box, CMA Sent: 07/16/2021   9:18 AM EDT To: Festus Aloe, MD  Please review.

## 2021-07-18 ENCOUNTER — Telehealth: Payer: Self-pay

## 2021-07-18 ENCOUNTER — Other Ambulatory Visit: Payer: Self-pay | Admitting: Urology

## 2021-07-18 MED ORDER — SILDENAFIL CITRATE 25 MG PO TABS
ORAL_TABLET | ORAL | 0 refills | Status: DC
Start: 2021-07-18 — End: 2021-10-14

## 2021-07-18 NOTE — Telephone Encounter (Signed)
Pharmacy states that sildenafil '20mg'$  is only for patients with pulmonary hypertension, for ED the patient would need 25, 50 or '100mg'$  rx.  Fax is [phone]

## 2021-10-14 ENCOUNTER — Ambulatory Visit (INDEPENDENT_AMBULATORY_CARE_PROVIDER_SITE_OTHER): Payer: Medicare HMO | Admitting: Urology

## 2021-10-14 ENCOUNTER — Encounter: Payer: Self-pay | Admitting: Urology

## 2021-10-14 VITALS — BP 151/57 | HR 70

## 2021-10-14 DIAGNOSIS — C61 Malignant neoplasm of prostate: Secondary | ICD-10-CM | POA: Diagnosis not present

## 2021-10-14 DIAGNOSIS — R351 Nocturia: Secondary | ICD-10-CM

## 2021-10-14 MED ORDER — LEUPROLIDE ACETATE (6 MONTH) 45 MG ~~LOC~~ KIT
45.0000 mg | PACK | Freq: Once | SUBCUTANEOUS | Status: AC
  Administered 2021-10-14: 45 mg via SUBCUTANEOUS

## 2021-10-14 MED ORDER — TADALAFIL 5 MG PO TABS: 5.0000 mg | ORAL_TABLET | Freq: Every day | ORAL | 0 refills | Status: DC

## 2021-10-14 NOTE — Progress Notes (Signed)
Eligard SubQ Injection  ? ?Due to Prostate Cancer patient is present today for a Eligard Injection. ? ?Medication: Eligard 6 month ?Dose: 45 mg  ?Location: right  ? ? ?Patient tolerated well, no complications were noted ? ?Performed by: Jasmain Ahlberg LPN ? ?

## 2021-10-14 NOTE — Progress Notes (Signed)
10/14/2021 10:17 AM   Andrew Ferrell Mar 02, 1955 [id number]  Referring provider: Lilian Coma [address] [place],  [place] [address]  No chief complaint on file.   HPI:  F/u -    1) Prostate cancer - high risk prostate cancer Stage IIC December 2022 being treated with LT-ADT (Feb 2023 - Feb 2025) and EBXRT completed Apr 2023 (pre-xrt spaceoar and fiducial markers done).    Biopsy: December 2022 high risk prostate cancer PSA 8.8 (7.5-10.5) T2b (left nodule) Gleason 4+5, 4+4, 4+3 in 10 - 40% of the cores, all on the left. 4/12   Biopsy done due to elevated PSA with PSA September 2022 of 7.5. His prior PSA March 2022 was 10.5, September 2020 4.9, July 2019 5.04 and a left sided prostate nodule on exam.    No h/o BPH. AUASS was 17 but today a zero - reports he is voiding well. Only has Noc x 2-3.     His dad had PCa.    He returns in management of the above. He has bothersome hot flash and low libido. No erections. Rx for sildenafil sent June 2023 but hasn't started. His June 2023 PSA was 0.3 and T 19.  He has nocturia x 3. No constipation. He has a good stream.    He is on Plavix. 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 He has DM. He saw Dr. Tobey Grim at [facility] - fax [phone]   PMH: Past Medical History:  Diagnosis Date   Cancer [facility])    prostate cancer   Collapsed lung    Diabetes mellitus without complication ([place])    Hypertension     Surgical History: Past Surgical History:  Procedure Laterality Date   BELOW KNEE LEG AMPUTATION Left    GOLD SEED IMPLANT N/A 03/28/2021   Procedure: GOLD SEED IMPLANT;  Surgeon: Cleon Gustin, MD;  Location: [facility];  Service: Urology;  Laterality: N/A;   SPACE OAR INSTILLATION N/A 03/28/2021   Procedure: SPACE OAR INSTILLATION;  Surgeon: Cleon Gustin, MD;  Location: [facility];  Service: Urology;  Laterality: N/A;    Home Medications:  Allergies as of 10/14/2021   No Known  Allergies      Medication List        Accurate as of October 14, 2021 10:17 AM. If you have any questions, ask your nurse or doctor.          acetaminophen 500 MG tablet Commonly known as: TYLENOL Take 500 mg by mouth 2 (two) times daily.   aspirin EC 81 MG tablet Take 81 mg by mouth daily.   atorvastatin 20 MG tablet Commonly known as: LIPITOR Take 20 mg by mouth daily.   benazepril-hydrochlorthiazide 5-6.25 MG tablet Commonly known as: LOTENSIN HCT Take 1 tablet by mouth daily.   clopidogrel 75 MG tablet Commonly known as: PLAVIX Take 75 mg by mouth daily.   empagliflozin 25 MG Tabs tablet Commonly known as: JARDIANCE Take 25 mg by mouth daily.   gabapentin 100 MG capsule Commonly known as: NEURONTIN Take 100 mg by mouth at bedtime.   gabapentin 300 MG capsule Commonly known as: NEURONTIN Take 300 mg by mouth 2 (two) times daily.   glipiZIDE 10 MG tablet Commonly known as: GLUCOTROL Take 10 mg by mouth 2 (two) times daily.   HYDROcodone-acetaminophen 5-325 MG tablet Commonly known as: NORCO/VICODIN Take 1 tablet by mouth every 6 (six) hours as needed for  moderate pain.   metFORMIN 1000 MG tablet Commonly known as: GLUCOPHAGE Take 1,000 mg by mouth 2 (two) times daily.   pioglitazone 15 MG tablet Commonly known as: ACTOS Take 15 mg by mouth daily.   sildenafil 25 MG tablet Commonly known as: VIAGRA Take 1 - 4 tablets by mouth as needed about 1 hour prior to sexual activity   traMADol 50 MG tablet Commonly known as: Ultram Take 1 tablet (50 mg total) by mouth every 6 (six) hours as needed.        Allergies: No Known Allergies  Family History: No family history on file.  Social History:  reports that he has been smoking cigarettes. He has been smoking an average of .25 packs per day. He uses smokeless tobacco. He reports current alcohol use. He reports that he does not use drugs.   Physical Exam: BP (!) 151/57   Pulse 70    Constitutional:  Alert and oriented, No acute distress. HEENT: [place] AT, moist mucus membranes.  Trachea midline, no masses. Cardiovascular: No clubbing, cyanosis, or edema. Respiratory: Normal respiratory effort, no increased work of breathing. GI: Abdomen is soft, nontender, nondistended, no abdominal masses GU: No CVA tenderness Lymph: No cervical or inguinal lymphadenopathy. Skin: No rashes, bruises or suspicious lesions. Neurologic: Grossly intact, no focal deficits, moving all 4 extremities. Psychiatric: Normal mood and affect.  Laboratory Data: No results found for: "WBC", "HGB", "HCT", "MCV", "PLT"  Lab Results  Component Value Date   CREATININE 0.87 03/27/2021    No results found for: "PSA"  Lab Results  Component Value Date   TESTOSTERONE 19 (L) 07/15/2021    Lab Results  Component Value Date   HGBA1C 7.9 (H) 03/27/2021    Urinalysis    Component Value Date/Time   APPEARANCEUR Clear 07/15/2021 1525   GLUCOSEU Negative 07/15/2021 1525   BILIRUBINUR Negative 07/15/2021 1525   PROTEINUR Negative 07/15/2021 1525   NITRITE Negative 07/15/2021 1525   LEUKOCYTESUR Trace (A) 07/15/2021 1525    Lab Results  Component Value Date   LABMICR See below: 07/15/2021   WBCUA 0-5 07/15/2021   LABEPIT 0-10 07/15/2021   BACTERIA None seen 07/15/2021       Assessment & Plan:    1. Prostate cancer [facility]) He was given Eligard 45 mg today. Check labs next time - pt didn't want blood drawn today.  - Urinalysis, Routine w reflex microscopic  2. Nocturia - disc trial of oab med or dialy pde5i. He will start a daily pde5i.   No follow-ups on file.  Festus Aloe, MD  [facility]  [address] [place], [place] [address] [phone]

## 2022-02-24 ENCOUNTER — Ambulatory Visit: Payer: Medicare HMO | Admitting: Urology

## 2022-04-14 ENCOUNTER — Ambulatory Visit: Payer: Medicare HMO | Admitting: Urology

## 2022-06-12 ENCOUNTER — Telehealth: Payer: Self-pay

## 2022-06-16 ENCOUNTER — Ambulatory Visit: Payer: Medicare HMO | Admitting: Urology

## 2022-07-14 ENCOUNTER — Encounter: Payer: Self-pay | Admitting: Urology

## 2022-07-14 ENCOUNTER — Ambulatory Visit (INDEPENDENT_AMBULATORY_CARE_PROVIDER_SITE_OTHER): Payer: Medicare HMO | Admitting: Urology

## 2022-07-14 VITALS — BP 114/67 | HR 106

## 2022-07-14 DIAGNOSIS — C61 Malignant neoplasm of prostate: Secondary | ICD-10-CM | POA: Diagnosis not present

## 2022-07-14 LAB — URINALYSIS, ROUTINE W REFLEX MICROSCOPIC
Bilirubin, UA: NEGATIVE
Leukocytes,UA: NEGATIVE
Nitrite, UA: NEGATIVE
Protein,UA: NEGATIVE
RBC, UA: NEGATIVE
Specific Gravity, UA: 1.015 (ref 1.005–1.030)
Urobilinogen, Ur: 0.2 mg/dL (ref 0.2–1.0)
pH, UA: 5.5 (ref 5.0–7.5)

## 2022-07-14 MED ORDER — OXYBUTYNIN CHLORIDE 5 MG PO TABS: 5.0000 mg | ORAL_TABLET | Freq: Two times a day (BID) | ORAL | 11 refills | Status: AC

## 2022-07-14 MED ORDER — LEUPROLIDE ACETATE (6 MONTH) 45 MG ~~LOC~~ KIT
45.0000 mg | PACK | Freq: Once | SUBCUTANEOUS | Status: AC
  Administered 2022-07-14: 45 mg via SUBCUTANEOUS

## 2022-07-14 NOTE — Progress Notes (Unsigned)
07/14/2022 11:47 AM   Andrew Ferrell [birth year] [id number]  Referring provider: Koleen Nimrod [address] [place],  [place] [address]  No chief complaint on file.   HPI:  F/u -    1) Prostate cancer - high risk prostate cancer Stage IIC December 2022 being treated with LT-ADT (Feb 2023 - Feb 2025) and EBXRT completed Apr 2023 (pre-xrt spaceoar and fiducial markers done). Biopsy done due to elevated PSA with PSA September 2022 of 7.5. His prior PSA March 2022 was 10.5, September 2020 4.9, July 2019 5.04 and a left sided prostate nodule on exam. His June 2023 PSA was 0.3 and T 19.  He has nocturia x 3. No constipation. He has a good stream.    No h/o BPH. AUASS was 17 but on f/u was 0. Noc x 2-3.     Biopsy: December 2022 high risk prostate cancer PSA 8.8 (7.5-10.5) T2b (left nodule) Gleason 4+5, 4+4, 4+3 in 10 - 40% of the cores, all on the left. 4/12  Staging:  Genetics: His dad had PCa.    2) ED - No erections. Rx for sildenafil sent June 2023 but hasn't started.    He returns in management of the above. He has hot flashes, frequency and urgency. Has UUI. Also bowel urgency.    He is on Plavix. 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 He has DM. He saw Dr. Gerda Diss at [facility] - fax [phone]  PMH: Past Medical History:  Diagnosis Date   Cancer [facility])    prostate cancer   Collapsed lung    Diabetes mellitus without complication ([facility])    Hypertension     Surgical History: Past Surgical History:  Procedure Laterality Date   BELOW KNEE LEG AMPUTATION Left    GOLD SEED IMPLANT N/A 03/28/2021   Procedure: GOLD SEED IMPLANT;  Surgeon: Malen Gauze, MD;  Location: [facility];  Service: Urology;  Laterality: N/A;   SPACE OAR INSTILLATION N/A 03/28/2021   Procedure: SPACE OAR INSTILLATION;  Surgeon: Malen Gauze, MD;  Location: [facility];  Service: Urology;  Laterality: N/A;    Home Medications:  Allergies as of 07/14/2022    No Known Allergies      Medication List        Accurate as of July 14, 2022 11:47 AM. If you have any questions, ask your nurse or doctor.          acetaminophen 500 MG tablet Commonly known as: TYLENOL Take 500 mg by mouth 2 (two) times daily.   aspirin EC 81 MG tablet Take 81 mg by mouth daily.   atorvastatin 20 MG tablet Commonly known as: LIPITOR Take 20 mg by mouth daily.   benazepril-hydrochlorthiazide 5-6.25 MG tablet Commonly known as: LOTENSIN HCT Take 1 tablet by mouth daily.   clopidogrel 75 MG tablet Commonly known as: PLAVIX Take 75 mg by mouth daily.   empagliflozin 25 MG Tabs tablet Commonly known as: JARDIANCE Take 25 mg by mouth daily.   gabapentin 100 MG capsule Commonly known as: NEURONTIN Take 100 mg by mouth at bedtime.   gabapentin 300 MG capsule Commonly known as: NEURONTIN Take 300 mg by mouth 2 (two) times daily.   glipiZIDE 10 MG tablet Commonly known as: GLUCOTROL Take 10 mg by mouth 2 (two) times daily.   HYDROcodone-acetaminophen 5-325 MG tablet Commonly known as: NORCO/VICODIN Take 1 tablet by mouth every 6 (six) hours as needed  for moderate pain.   metFORMIN 1000 MG tablet Commonly known as: GLUCOPHAGE Take 1,000 mg by mouth 2 (two) times daily.   pioglitazone 15 MG tablet Commonly known as: ACTOS Take 15 mg by mouth daily.   tadalafil 5 MG tablet Commonly known as: CIALIS Take 1 tablet (5 mg total) by mouth daily.   traMADol 50 MG tablet Commonly known as: Ultram Take 1 tablet (50 mg total) by mouth every 6 (six) hours as needed.        Allergies: No Known Allergies  Family History: No family history on file.  Social History:  reports that he has been smoking cigarettes. He has been smoking an average of .25 packs per day. He uses smokeless tobacco. He reports current alcohol use. He reports that he does not use drugs.   Physical Exam: BP 114/67   Pulse (!) 106   Constitutional:  Alert and oriented,  No acute distress. HEENT: [place] AT, moist mucus membranes.  Trachea midline, no masses. Cardiovascular: No clubbing, cyanosis, or edema. Respiratory: Normal respiratory effort, no increased work of breathing. GI: Abdomen is soft, nontender, nondistended, no abdominal masses GU: No CVA tenderness Lymph: No cervical or inguinal lymphadenopathy. Skin: No rashes, bruises or suspicious lesions. Neurologic: Grossly intact, no focal deficits, moving all 4 extremities. Psychiatric: Normal mood and affect.  Laboratory Data: No results found for: "WBC", "HGB", "HCT", "MCV", "PLT"  Lab Results  Component Value Date   CREATININE 0.87 03/27/2021    No results found for: "PSA"  Lab Results  Component Value Date   TESTOSTERONE 19 (L) 07/15/2021    Lab Results  Component Value Date   HGBA1C 7.9 (H) 03/27/2021    Urinalysis    Component Value Date/Time   APPEARANCEUR Clear 07/15/2021 1525   GLUCOSEU Negative 07/15/2021 1525   BILIRUBINUR Negative 07/15/2021 1525   PROTEINUR Negative 07/15/2021 1525   NITRITE Negative 07/15/2021 1525   LEUKOCYTESUR Trace (A) 07/15/2021 1525    Lab Results  Component Value Date   LABMICR See below: 07/15/2021   WBCUA 0-5 07/15/2021   LABEPIT 0-10 07/15/2021   BACTERIA None seen 07/15/2021    Pertinent Imaging: N/a   Assessment & Plan:    1. Prostate cancer (HCC) - PSA and T sent. He declined labs. He said he is old and just wants his shot. Discussed importance of monitoring his cancer treatment and he declines.   He was given Eligard 45 mg today.   2. Frequency, urgency - recent article on oxyb for prostate ca hot flashes and OAB symptoms - he will start.   He wants a "shot" and no labs.  - leuprolide (6 Month) (ELIGARD) injection 45 mg - Urinalysis, Routine w reflex microscopic   No follow-ups on file.  Jerilee Field, MD  [facility]  [address] [place], [place] [address] [phone]

## 2022-07-14 NOTE — Progress Notes (Signed)
Eligard SubQ Injection   Due to Prostate Cancer patient is present today for a Eligard Injection.  Medication: Eligard 6 month Dose: 45 mg  Location: left  Lot: [id number]  Exp: [id number]  Patient tolerated well, no complications were noted  Performed by: Kennyth Lose, CMA

## 2022-07-23 NOTE — Telephone Encounter (Signed)
Opened in error

## 2022-08-04 ENCOUNTER — Telehealth: Payer: Self-pay

## 2022-08-04 NOTE — Telephone Encounter (Signed)
Andrew Ferrell with [employer] called to see if you received a fax for incontinence supplies.  She is requesting MD sign and form be faxed back.  Please reach out if the form needs to be re faxed.

## 2022-08-07 NOTE — Telephone Encounter (Signed)
Patient left voicemail today with office of incontinence form to be completed by MD to have supplies shipped to home. Message sent to MD CMA

## 2022-08-19 ENCOUNTER — Telehealth: Payer: Self-pay

## 2022-08-19 NOTE — Telephone Encounter (Signed)
Return patient call from vm left about incontinence supplies form. Patient is made aware that we have the form and Dr. Mena Goes is out of clinic until next week. Patient is aware that I will have Dr. Mena Goes sign off on form and faxed to [facility] on Monday. Patient voiced understanding.

## 2022-09-01 NOTE — Telephone Encounter (Signed)
Patient is made aware form to Andrew Ferrell was faxed today. Patient voiced understanding

## 2022-09-01 NOTE — Telephone Encounter (Signed)
Patient calling to check if form was faxed to [facility]?  Please advise.

## 2023-03-06 IMAGING — PT NM PET TUM IMG SKULL BASE T - THIGH
5 series · 25 of 25 positions shown · non-contrast
Comparison: None.

CLINICAL DATA: Prostate cancer. Positive biopsy with high risk
staging. PSA equal 8.8.

EXAM:
NUCLEAR MEDICINE PET SKULL BASE TO THIGH
TECHNIQUE: 7.5 mCi F18 Piflufolastat (Pylarify) was injected intravenously.
Full-ring PET imaging was performed from the skull base to thigh
after the radiotracer. CT data was obtained and used for attenuation
correction and anatomic localization.

[Series 3: pet sk_thigh ac · axial · 5.0mm · 4.07mm/px · z∈[-1428,-432]mm · 7 of 250 slices shown]
[im 1/250]
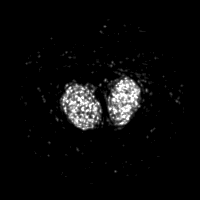
[im 42/250]
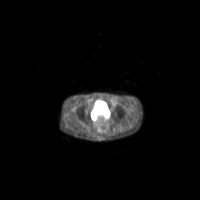
[im 84/250]
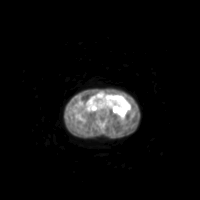
[im 125/250]
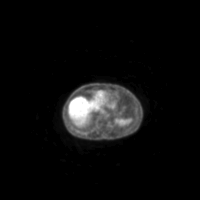
[im 167/250]
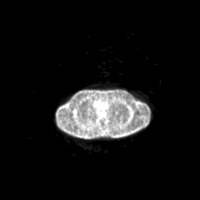
[im 208/250]
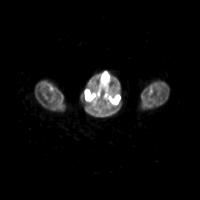
[im 250/250]
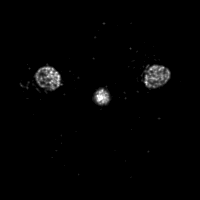

[Series 5: pet sk_thigh nac · axial · 5.0mm · 4.07mm/px · z∈[-1428,-432]mm · 8 of 250 slices shown]
[im 1/250]
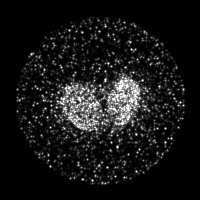
[im 36/250]
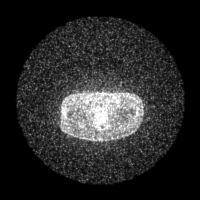
[im 72/250]
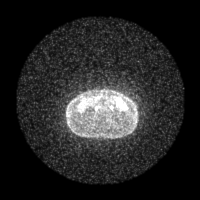
[im 107/250]
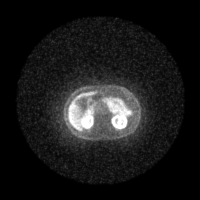
[im 143/250]
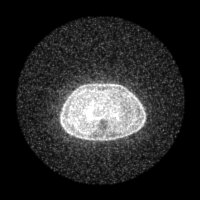
[im 178/250]
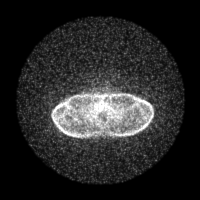
[im 214/250]
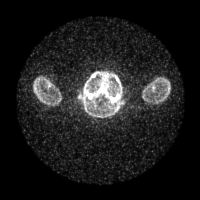
[im 250/250]
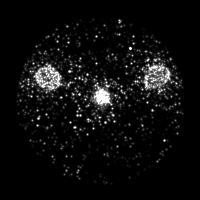

[Series 603: <mip collection> · coronal · 2.07mm/px · 1 of 32 slices shown]
[im 1/32]
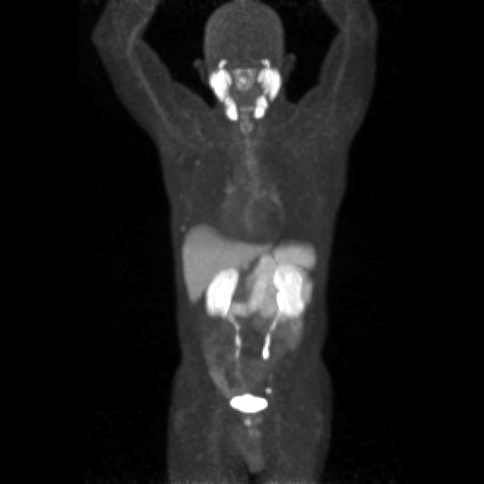

[Series 604: fused cor · 2 of 57 slices shown]
[im 1/57]
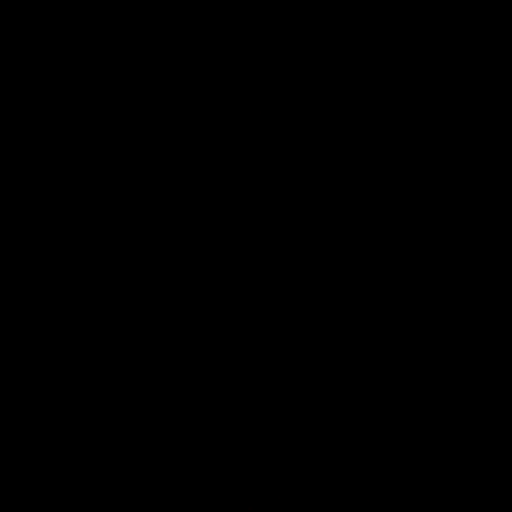
[im 57/57]
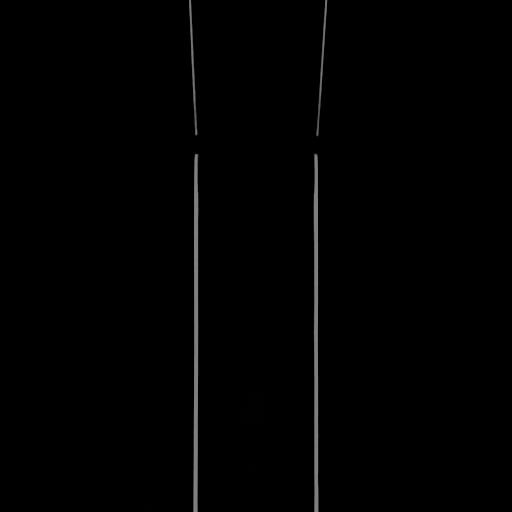

[Series 605: range-ct sk_thigh 5.0 bf37-tra-<alpha range> · 7 of 239 slices shown]
[im 1/239]
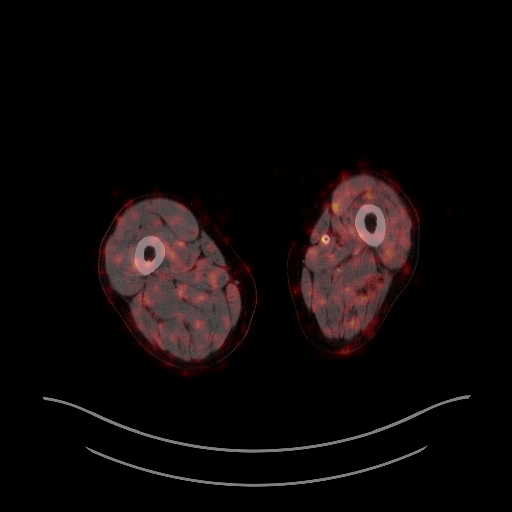
[im 40/239]
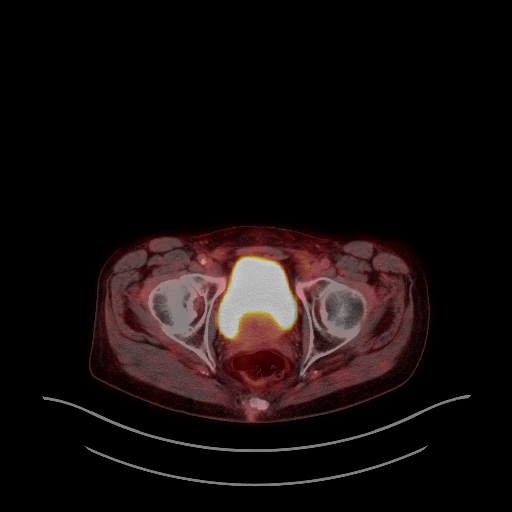
[im 80/239]
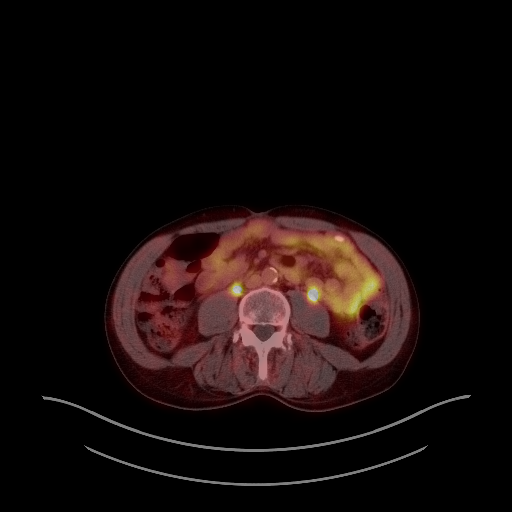
[im 120/239]
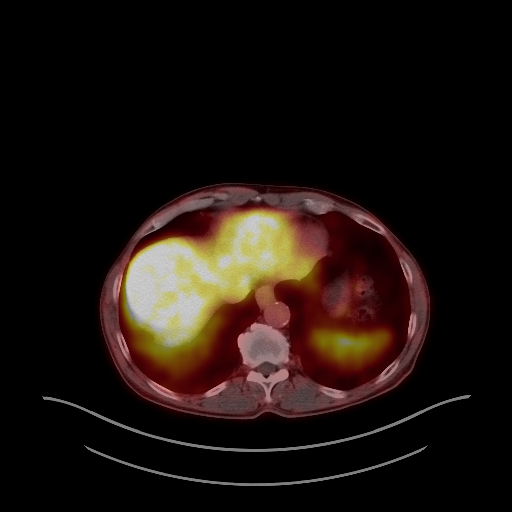
[im 159/239]
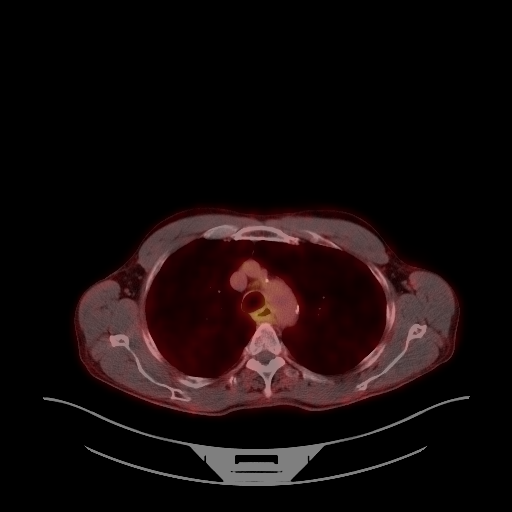
[im 199/239]
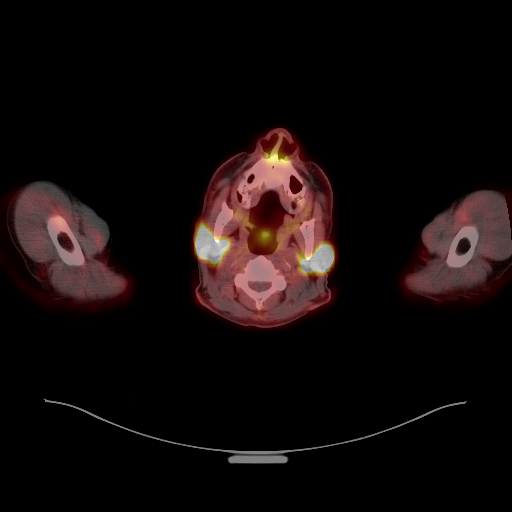
[im 239/239]
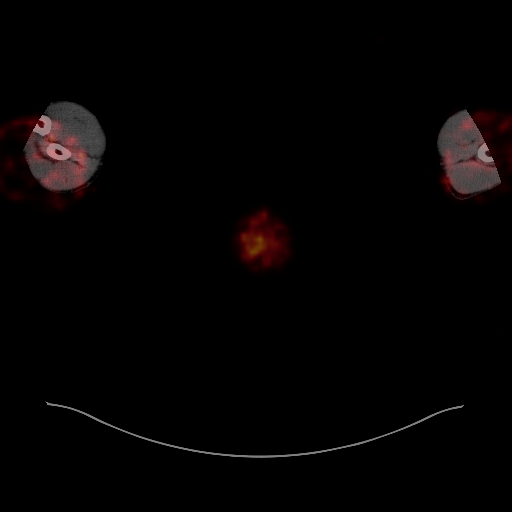

[25 of 25 positions shown; findings below may reference images not displayed]

FINDINGS: NECK

No radiotracer activity in neck lymph nodes.

Incidental CT finding: None

CHEST

No radiotracer accumulation within mediastinal or hilar lymph nodes.

There are 3 pulmonary nodules in the RIGHT lung which are not
changed from CT scan 11/14/2020. Nodule along the RIGHT fissure
measuring 8 mm (image 41/8). Nodule in the RIGHT middle lobe
measuring 6 mm image 46. Nodule in the posterior RIGHT lower lobe
measuring 7 mm (image 64).

These nodules do not have radiotracer activity.

Paraseptal emphysema in the RIGHT upper lobe.

Incidental CT finding: None

ABDOMEN/PELVIS

Prostate: There is focal activity in the posterior RIGHT aspect of
the prostate gland with SUV max equal 9.9 (image 218). This nodule
measures approximately 1 cm.

Lymph nodes: No abnormal radiotracer accumulation within pelvic or
abdominal nodes.

Liver: No evidence of liver metastasis

Incidental CT finding: Atherosclerotic calcification of the aorta.

SKELETON

There several very low activity foci within the RIGHT ribs on image
85 and 118. For example lesion in the RIGHT lateral rib on [DATE]
with SUV max equal 2.1. This low activity is nonspecific and favored
benign.
IMPRESSION: 1. Intense activity in the posterior RIGHT aspect the prostate gland
consistent primary prostate adenocarcinoma.
2. No evidence of metastatic adenopathy in the pelvis or periaortic
retroperitoneum.
3. No evidence of visceral metastasis.
4. No evidence skeletal metastasis. Several foci of very mild
activity in the RIGHT ribs are favored benign.
5. Three pulmonary nodules in the RIGHT lung without radiotracer
activity. Recommend follow-up CT of the thorax in 6 months
demonstrate stability (current smoker).

## 2023-05-26 DIAGNOSIS — M810 Age-related osteoporosis without current pathological fracture: Secondary | ICD-10-CM | POA: Diagnosis not present

## 2023-05-26 DIAGNOSIS — E1122 Type 2 diabetes mellitus with diabetic chronic kidney disease: Secondary | ICD-10-CM | POA: Diagnosis not present

## 2023-05-26 DIAGNOSIS — N529 Male erectile dysfunction, unspecified: Secondary | ICD-10-CM | POA: Diagnosis not present

## 2023-05-26 DIAGNOSIS — Z8546 Personal history of malignant neoplasm of prostate: Secondary | ICD-10-CM | POA: Diagnosis not present

## 2023-05-26 DIAGNOSIS — R269 Unspecified abnormalities of gait and mobility: Secondary | ICD-10-CM | POA: Diagnosis not present

## 2023-05-26 DIAGNOSIS — N181 Chronic kidney disease, stage 1: Secondary | ICD-10-CM | POA: Diagnosis not present

## 2023-05-26 DIAGNOSIS — E1136 Type 2 diabetes mellitus with diabetic cataract: Secondary | ICD-10-CM | POA: Diagnosis not present

## 2023-05-26 DIAGNOSIS — Z89512 Acquired absence of left leg below knee: Secondary | ICD-10-CM | POA: Diagnosis not present

## 2023-05-26 DIAGNOSIS — E1142 Type 2 diabetes mellitus with diabetic polyneuropathy: Secondary | ICD-10-CM | POA: Diagnosis not present

## 2023-05-26 DIAGNOSIS — Z9710 Presence of artificial limb (complete) (partial), unspecified: Secondary | ICD-10-CM | POA: Diagnosis not present

## 2023-05-26 DIAGNOSIS — I700 Atherosclerosis of aorta: Secondary | ICD-10-CM | POA: Diagnosis not present

## 2023-05-26 DIAGNOSIS — E1151 Type 2 diabetes mellitus with diabetic peripheral angiopathy without gangrene: Secondary | ICD-10-CM | POA: Diagnosis not present

## 2023-05-26 DIAGNOSIS — F17210 Nicotine dependence, cigarettes, uncomplicated: Secondary | ICD-10-CM | POA: Diagnosis not present

## 2023-05-26 DIAGNOSIS — I129 Hypertensive chronic kidney disease with stage 1 through stage 4 chronic kidney disease, or unspecified chronic kidney disease: Secondary | ICD-10-CM | POA: Diagnosis not present

## 2023-05-26 DIAGNOSIS — R32 Unspecified urinary incontinence: Secondary | ICD-10-CM | POA: Diagnosis not present

## 2023-05-26 DIAGNOSIS — J439 Emphysema, unspecified: Secondary | ICD-10-CM | POA: Diagnosis not present

## 2023-05-26 DIAGNOSIS — E785 Hyperlipidemia, unspecified: Secondary | ICD-10-CM | POA: Diagnosis not present

## 2023-06-02 DIAGNOSIS — C61 Malignant neoplasm of prostate: Secondary | ICD-10-CM | POA: Diagnosis not present

## 2023-12-15 DIAGNOSIS — Z1339 Encounter for screening examination for other mental health and behavioral disorders: Secondary | ICD-10-CM | POA: Diagnosis not present

## 2023-12-15 DIAGNOSIS — Z7189 Other specified counseling: Secondary | ICD-10-CM | POA: Diagnosis not present

## 2023-12-15 DIAGNOSIS — F17210 Nicotine dependence, cigarettes, uncomplicated: Secondary | ICD-10-CM | POA: Diagnosis not present

## 2023-12-15 DIAGNOSIS — Z1331 Encounter for screening for depression: Secondary | ICD-10-CM | POA: Diagnosis not present

## 2023-12-15 DIAGNOSIS — Z299 Encounter for prophylactic measures, unspecified: Secondary | ICD-10-CM | POA: Diagnosis not present

## 2023-12-15 DIAGNOSIS — Z0000 Encounter for general adult medical examination without abnormal findings: Secondary | ICD-10-CM | POA: Diagnosis not present

## 2023-12-15 DIAGNOSIS — Z23 Encounter for immunization: Secondary | ICD-10-CM | POA: Diagnosis not present

## 2023-12-15 DIAGNOSIS — I10 Essential (primary) hypertension: Secondary | ICD-10-CM | POA: Diagnosis not present
